# Patient Record
Sex: Female | Born: 1990 | Race: Black or African American | Hispanic: No | Marital: Single | Smoking: Current every day smoker
Health system: Southern US, Community
[De-identification: ages and names within clinical notes are randomized; demographics above are authoritative.]

## PROBLEM LIST (undated history)

## (undated) DIAGNOSIS — J45909 Unspecified asthma, uncomplicated: Secondary | ICD-10-CM

## (undated) DIAGNOSIS — O26899 Other specified pregnancy related conditions, unspecified trimester: Secondary | ICD-10-CM

## (undated) DIAGNOSIS — R197 Diarrhea, unspecified: Secondary | ICD-10-CM

## (undated) DIAGNOSIS — J019 Acute sinusitis, unspecified: Secondary | ICD-10-CM

## (undated) DIAGNOSIS — R112 Nausea with vomiting, unspecified: Principal | ICD-10-CM

## (undated) MED ORDER — IBUPROFEN 600 MG TAB
600 mg | ORAL_TABLET | Freq: Four times a day (QID) | ORAL | Status: DC | PRN
Start: ? — End: 2014-02-21

## (undated) MED ORDER — DIPHENHYDRAMINE 25 MG CAP
25 mg | ORAL_CAPSULE | Freq: Four times a day (QID) | ORAL | Status: AC | PRN
Start: ? — End: 2011-10-31

## (undated) MED ORDER — TRIMETHOPRIM-SULFAMETHOXAZOLE 160 MG-800 MG TAB
160-800 mg | ORAL_TABLET | Freq: Two times a day (BID) | ORAL | Status: AC
Start: ? — End: 2011-12-03

## (undated) MED ORDER — BENZONATATE 200 MG CAP
200 mg | ORAL_CAPSULE | Freq: Three times a day (TID) | ORAL | Status: AC | PRN
Start: ? — End: 2011-12-03

## (undated) MED ORDER — METHOCARBAMOL 500 MG TAB
500 mg | ORAL_TABLET | Freq: Every evening | ORAL | Status: DC
Start: ? — End: 2014-02-21

## (undated) MED ORDER — TRIMETHOPRIM-SULFAMETHOXAZOLE 160 MG-800 MG TAB
160-800 mg | ORAL_TABLET | Freq: Two times a day (BID) | ORAL | Status: AC
Start: ? — End: 2007-06-05

## (undated) MED ORDER — KETOROLAC TROMETHAMINE 10 MG TAB
10 mg | ORAL_TABLET | Freq: Four times a day (QID) | ORAL | Status: DC | PRN
Start: ? — End: 2014-02-21

## (undated) MED ORDER — AZITHROMYCIN 250 MG TAB
250 mg | PACK | ORAL | Status: AC
Start: ? — End: 2007-06-15

---
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
## 2009-01-18 NOTE — ED Notes (Signed)
Pt c/o nausea and cough x 2-3 days.

## 2009-01-18 NOTE — ED Notes (Signed)
E-sig not working. Paper copy signed

## 2009-01-18 NOTE — ED Provider Notes (Signed)
HPI Comments: The patient presents to ed with complaint of fever, cough, chills, and body aches. The patient state that it began two days ago. Patient refuses any treatment with needles.     Patient is a 19 y.o. female presenting with nausea. The history is provided by the patient. No language interpreter was used.   Nausea   This is a new problem. The current episode started 2 days ago. The problem occurs 2 to 4 times per day. The problem has not changed since onset. The emesis has an appearance of stomach contents. There has been a fever of 102 - 102.9 F. The fever has been present for 1 - 2 days. Associated symptoms include chills, a fever, cough and URI.        Past Medical History   Diagnosis Date   ??? Asthma           No past surgical history on file.      No family history on file.     History   Social History   ??? Marital Status: Single     Spouse Name: N/A     Number of Children: N/A   ??? Years of Education: N/A   Occupational History   ??? Not on file.   Social History Main Topics   ??? Smoking status: Current Everyday Smoker -- 0.5 packs/day   ??? Smokeless tobacco: Never Used   ??? Alcohol Use: Yes      Occasionally   ??? Drug Use: No   ??? Sexually Active: Yes     Birth Control/ Protection: None   Other Topics Concern   ??? Not on file   Social History Narrative   ??? No narrative on file           ALLERGIES: Review of patient's allergies indicates no known allergies.      Review of Systems   Constitutional: Positive for fever and chills.   Respiratory: Positive for cough.    Gastrointestinal: Positive for nausea.   All other systems reviewed and are negative.        Filed Vitals:    01/18/2009  7:03 PM 01/18/2009  8:22 PM 01/18/2009  8:31 PM   BP: 112/73 102/52    Pulse: 113 96    Temp: 102.6 ??F (39.2 ??C) 101.5 ??F (38.6 ??C) 101.1 ??F (38.4 ??C)   Resp: 18 20    Height: 5\' 8"  (1.727 m)     Weight: 180 lb (81.647 kg)     SpO2: 99% 99%               Physical Exam   Nursing note and vitals reviewed.   Constitutional: She is oriented to person, place, and time. She appears well-developed and well-nourished.   HENT:   Head: Normocephalic and atraumatic.   Right Ear: External ear normal.   Left Ear: External ear normal.   Nose: Nose normal.   Mouth/Throat: Oropharynx is clear and moist.   Eyes: Conjunctivae and extraocular motions are normal. Pupils are equal, round, and reactive to light.   Neck: Normal range of motion. Neck supple.   Cardiovascular: Normal rate, regular rhythm, normal heart sounds and intact distal pulses.    Pulmonary/Chest: Effort normal and breath sounds normal. No respiratory distress. She has no wheezes.   Abdominal: Soft. Bowel sounds are normal.   Musculoskeletal: Normal range of motion. She exhibits no edema and no tenderness.   Neurological: She is alert and oriented to person, place, and time.  No cranial nerve deficit. She exhibits normal muscle tone.   Skin: Skin is warm. She is diaphoretic.   Psychiatric: She has a normal mood and affect. Her behavior is normal. Judgment and thought content normal.        Coding    Procedures

## 2009-01-18 NOTE — ED Notes (Signed)
I have reviewed discharge instructions with the patient.  The patient verbalized understanding.

## 2009-01-19 LAB — INFLUENZA A & B AG (RAPID TEST)
Influenza A Ag: NEGATIVE
Influenza B Ag: NEGATIVE

## 2009-01-19 LAB — URINE MICROSCOPIC
Casts: 0 /LPF
Crystals, urine: 0 /LPF
Mucus: 0 /LPF
RBC: 0 /HPF

## 2009-01-19 LAB — HCG URINE, QL. - POC: Pregnancy test,urine (POC): NEGATIVE

## 2009-01-19 MED ORDER — NAPROXEN 500 MG TAB
500 mg | ORAL_TABLET | Freq: Two times a day (BID) | ORAL | Status: AC
Start: 2009-01-19 — End: 2009-01-28

## 2009-01-19 MED ORDER — METHYLPREDNISOLONE 4 MG TABS IN A DOSE PACK
4 mg | PACK | ORAL | Status: AC
Start: 2009-01-19 — End: 2009-01-24

## 2009-01-19 MED ADMIN — acetaminophen (TYLENOL) tablet 1,000 mg: ORAL | @ 01:00:00 | NDC 00182845389

## 2009-01-19 MED FILL — ACETAMINOPHEN 500 MG TAB: 500 mg | ORAL | Qty: 2

## 2009-09-30 NOTE — ED Provider Notes (Signed)
HPI Comments: Michelle Shaw is a 19 y.o. African American female who c/o stomach ache x 1 week. Around her belly button and on both sides. Been constant for a week. Nothing makes better or worse. No n/v/d. No fever. LMP August 10th putting her at 6 weeks by dates. No vaginal bleeding. No doctor of any kind. Denies all urinary symptoms. No trouble with stools.          Patient is a 19 y.o. female presenting with abdominal pain and Pregnant Now. The history is provided by the patient.   Abdominal Pain     Pregnancy Problem  Associated symptoms include abdominal pain.        Past Medical History   Diagnosis Date   ??? Asthma           No past surgical history on file.      No family history on file.     History   Social History   ??? Marital Status: Single     Spouse Name: N/A     Number of Children: N/A   ??? Years of Education: N/A   Occupational History   ??? Not on file.   Social History Main Topics   ??? Smoking status: Former Smoker -- 0.5 packs/day     Quit date: 08/30/2009   ??? Smokeless tobacco: Never Used   ??? Alcohol Use: Yes      Occasionally   ??? Drug Use: No   ??? Sexually Active: Yes     Birth Control/ Protection: None   Other Topics Concern   ??? Not on file   Social History Narrative   ??? No narrative on file                    ALLERGIES: Review of patient's allergies indicates no known allergies.      Review of Systems   Gastrointestinal: Positive for abdominal pain.   All other systems reviewed and are negative.        Filed Vitals:    09/30/09 2058   BP: 130/65   Pulse: 76   Temp: 98.2 ??F (36.8 ??C)   Resp: 18   Height: 5\' 7"  (1.702 m)   Weight: 215 lb (97.523 kg)   SpO2: 100%              Physical Exam   Nursing note and vitals reviewed.  Constitutional: She is oriented to person, place, and time. She appears well-developed and well-nourished. No distress.        smiling   HENT:   Head: Normocephalic and atraumatic.   Right Ear: External ear normal.   Left Ear: External ear normal.    Eyes: Conjunctivae and EOM are normal. No scleral icterus.   Neck: Neck supple.   Cardiovascular: Normal rate.    Pulmonary/Chest: Effort normal.   Abdominal: Soft. Bowel sounds are normal. She exhibits no distension. No tenderness.   Musculoskeletal: Normal range of motion.   Neurological: She is alert and oriented to person, place, and time.   Skin: Skin is warm and dry. She is not diaphoretic.   Psychiatric: She has a normal mood and affect. Her behavior is normal.        MDM    Procedures

## 2009-09-30 NOTE — ED Notes (Cosign Needed)
Urine dip neg  ucg pos

## 2009-09-30 NOTE — ED Notes (Signed)
Pt states intermittent nausea and stomach pains. Pt denies urinary discomfort, denies vaginal discharge. Pt in NAD at this time, VS as noted.

## 2009-10-01 LAB — METABOLIC PANEL, BASIC
Anion gap: 7 mmol/L (ref 7–16)
BUN: 10 MG/DL (ref 6–23)
CO2: 26 MMOL/L (ref 23–32)
Calcium: 8.7 MG/DL (ref 8.3–10.4)
Chloride: 103 MMOL/L (ref 98–107)
Creatinine: 0.9 MG/DL (ref 0.6–1.5)
GFR est AA: >60 mL/min/{1.73_m2} (ref >60)
GFR est non-AA: >60 mL/min/{1.73_m2} (ref >60)
Glucose: 72 MG/DL (ref 65–100)
Potassium: 3.6 MMOL/L (ref 3.5–5.1)
Sodium: 136 MMOL/L (ref 136–145)

## 2009-10-01 LAB — HCG URINE, QL. - POC: Pregnancy test,urine (POC): POSITIVE — AB

## 2009-10-01 LAB — BETA HCG, QT
Beta HCG, QT: 17372 m[IU]/mL — ABNORMAL HIGH (ref 0.0–6.0)
hCG Quant: 17372 m[IU]/mL — ABNORMAL HIGH (ref 0.0–6.0)

## 2009-10-01 LAB — CBC WITH AUTOMATED DIFF
ABS. BASOPHILS: 0 10*3/uL (ref 0.0–0.2)
ABS. EOSINOPHILS: 0.2 10*3/uL (ref 0.0–0.8)
ABS. IMM. GRANS.: 0 10*3/uL (ref 0.0–2.0)
ABS. LYMPHOCYTES: 1.7 10*3/uL (ref 0.5–4.6)
ABS. MONOCYTES: 0.4 10*3/uL (ref 0.1–1.3)
ABS. NEUTROPHILS: 2.7 10*3/uL (ref 1.7–8.2)
BASOPHILS: 0 % (ref 0.0–2.0)
EOSINOPHILS: 4 % (ref 0.5–7.8)
HCT: 36.3 % (ref 35.8–46.3)
HGB: 12 g/dL (ref 11.7–15.4)
IMMATURE GRANULOCYTES: 0.2 % (ref 0.0–2.0)
LYMPHOCYTES: 34 % (ref 13–44)
MCH: 27.9 PG (ref 26.1–32.9)
MCHC: 33.1 g/dL (ref 31.4–35.0)
MCV: 84.4 FL (ref 79.6–97.8)
MONOCYTES: 8 % (ref 4.0–12.0)
MPV: 10.6 FL — ABNORMAL LOW (ref 10.8–14.1)
NEUTROPHILS: 54 % (ref 43–78)
PLATELET: 196 10*3/uL (ref 150–450)
RBC: 4.3 M/uL (ref 4.05–5.25)
RDW: 12.3 % (ref 11.9–14.6)
WBC: 5 10*3/uL (ref 4.5–13.5)

## 2009-10-01 LAB — URINE MICROSCOPIC
Casts: 0 /LPF
Crystals, urine: 0 /LPF
Mucus: 0 /LPF
RBC: 0 /HPF

## 2009-10-01 MED ORDER — ACETAMINOPHEN 500 MG TAB
500 mg | ORAL | Status: AC
Start: 2009-10-01 — End: 2009-09-30
  Administered 2009-10-01: 03:00:00 via ORAL

## 2009-10-01 NOTE — ED Notes (Signed)
IUP, yolk sac, gestational sac, but no embryo. Ovaries visualized well per Jesusita Oka.

## 2009-10-01 NOTE — ED Notes (Signed)
Discharge instructions discussed with patient, patient verbalized understanding and denies questions. Patient ambulatory for discharge home with mother to drive patient home.

## 2009-12-02 MED ORDER — HYDROCODONE-ACETAMINOPHEN 5 MG-500 MG TAB
5-500 mg | ORAL_TABLET | ORAL | Status: AC | PRN
Start: 2009-12-02 — End: 2009-12-09

## 2009-12-02 NOTE — ED Notes (Signed)
Patient not in room x2
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
## 2009-12-02 NOTE — ED Notes (Signed)
Left prior to receiving discharge instructions and prescription

## 2009-12-02 NOTE — ED Notes (Signed)
Patient not in room

## 2009-12-02 NOTE — ED Provider Notes (Signed)
Patient is a 19 y.o. female presenting with hip pain and abdominal pain. The history is provided by the patient.   Hip Pain   This is a new problem. The current episode started more than 1 week ago. The problem occurs constantly. The problem has not changed since onset. The pain is present in the right hip (superpubic). The pain is at a severity of 8/10. Pertinent negatives include no numbness and full range of motion. She has tried nothing for the symptoms.   Abdominal Pain          Past Medical History   Diagnosis Date   ??? Asthma           No past surgical history on file.      No family history on file.     History   Social History   ??? Marital Status: Single     Spouse Name: N/A     Number of Children: N/A   ??? Years of Education: N/A   Occupational History   ??? Not on file.   Social History Main Topics   ??? Smoking status: Former Smoker -- 0.5 packs/day     Quit date: 08/30/2009   ??? Smokeless tobacco: Never Used   ??? Alcohol Use: No   ??? Drug Use: No   ??? Sexually Active: Yes     Birth Control/ Protection: None   Other Topics Concern   ??? Not on file   Social History Narrative   ??? No narrative on file                    ALLERGIES: Review of patient's allergies indicates no known allergies.      Review of Systems   Constitutional: Negative.  Negative for activity change.   HENT: Negative.    Eyes: Negative.    Respiratory: Negative.    Cardiovascular: Negative.    Gastrointestinal: Positive for abdominal pain.   Genitourinary: Negative.    Musculoskeletal: Negative.    Skin: Negative.    Neurological: Negative.  Negative for numbness.   Hematological: Negative.    Psychiatric/Behavioral: Negative.    All other systems reviewed and are negative.        Filed Vitals:    12/02/09 1240   BP: 101/65   Pulse: 97   Temp: 98.2 ??F (36.8 ??C)   Resp: 18   Height: 5\' 7"  (1.702 m)   Weight: 195 lb (88.451 kg)   SpO2: 98%              Physical Exam   Nursing note and vitals reviewed.   Constitutional: She is oriented to person, place, and time. She appears well-developed and well-nourished.   HENT:   Head: Normocephalic and atraumatic.   Right Ear: External ear normal.   Left Ear: External ear normal.   Eyes: Conjunctivae and EOM are normal. Pupils are equal, round, and reactive to light.   Neck: Normal range of motion. Neck supple.   Cardiovascular: Normal rate, regular rhythm and intact distal pulses.    Pulmonary/Chest: Effort normal and breath sounds normal.   Abdominal: Soft. Bowel sounds are normal.   Musculoskeletal: Normal range of motion.   Neurological: She is alert and oriented to person, place, and time. No cranial nerve deficit.   Skin: Skin is warm and dry.   Psychiatric: She has a normal mood and affect.        MDM     Amount and/or Complexity of Data  Reviewed:   Clinical lab tests:  Ordered and reviewed  Risk of Significant Complications, Morbidity, and/or Mortality:   Presenting problems:  Moderate  Diagnostic procedures:  Low  Management options:  Low  Progress:   Patient progress:  Stable      Procedures

## 2010-01-11 LAB — URINE MICROSCOPIC
Casts: 0 /LPF
Crystals, urine: 0 /LPF
Mucus: 0 /LPF

## 2010-01-11 LAB — WET PREP: Wet prep: 5

## 2010-01-11 MED ORDER — METRONIDAZOLE 0.75 % TOPICAL CREAM
0.75 % | Freq: Two times a day (BID) | CUTANEOUS | Status: DC
Start: 2010-01-11 — End: 2010-05-11

## 2010-01-11 MED ORDER — CEPHALEXIN 250 MG CAP
250 mg | ORAL_CAPSULE | Freq: Three times a day (TID) | ORAL | Status: AC
Start: 2010-01-11 — End: 2010-01-18

## 2010-01-11 NOTE — ED Provider Notes (Signed)
HPI Comments: Patient is a 19 yo female who c/o vaginal discharge and pain x 3 days. Patient denies F/N/V/D. Admits to a new sexual partner and is concerned of having an STD. Patient denies dysuria, frequency, urgency, flank pain or vaginal bleeding. Some LLQ pain on palpation. Thin, white discharge and erythema present on pelvic exam. Treated pt for UTI and BV. Instructed her to follow up with her OB.      Pmhx: asthma, previous STDs  Shx: former smoker    Patient is a 19 y.o. female presenting with vaginal discharge. The history is provided by the patient.   Vaginal Discharge   This is a new problem. The current episode started 2 days ago. The problem occurs constantly. The problem has been gradually worsening. The discharge occurs spontaneously. The discharge was white, malodorous and thin. She is pregnant. Associated symptoms include abdominal pain (occasionally), genital burning and genital itching. Pertinent negatives include no anorexia, no diaphoresis, no fever, no abdominal swelling, no constipation, no diarrhea, no nausea, no vomiting, no dysuria and no frequency. Risk factors include history of STDs. She has tried nothing for the symptoms. Her past medical history is significant for STD.        Past Medical History   Diagnosis Date   ??? Asthma           No past surgical history on file.      No family history on file.     History   Social History   ??? Marital Status: Single     Spouse Name: N/A     Number of Children: N/A   ??? Years of Education: N/A   Occupational History   ??? Not on file.   Social History Main Topics   ??? Smoking status: Former Smoker     Quit date: 08/30/2009   ??? Smokeless tobacco: Never Used   ??? Alcohol Use: No   ??? Drug Use: No   ??? Sexually Active: Yes     Birth Control/ Protection: None   Other Topics Concern   ??? Not on file   Social History Narrative   ??? No narrative on file                    ALLERGIES: Review of patient's allergies indicates no known allergies.      Review of Systems    Constitutional: Negative for fever, diaphoresis and fatigue.   Cardiovascular: Negative for chest pain and palpitations.   Gastrointestinal: Positive for abdominal pain (occasionally). Negative for nausea, vomiting, diarrhea, constipation and anorexia.   Genitourinary: Positive for vaginal discharge. Negative for dysuria, urgency, frequency, hematuria, flank pain, vaginal bleeding, difficulty urinating and pelvic pain.   Skin: Negative for color change and pallor.   Psychiatric/Behavioral: Negative for behavioral problems, confusion and agitation.       Filed Vitals:    01/11/10 1128   BP: 108/55   Pulse: 77   Temp: 98.4 ??F (36.9 ??C)   Resp: 18   Height: 5\' 7"  (1.702 m)   SpO2: 100%              Physical Exam   Constitutional: She is oriented to person, place, and time. Vital signs are normal. She appears well-developed and well-nourished.  Non-toxic appearance. She does not have a sickly appearance. She does not appear ill. No distress.   HENT:   Head: Normocephalic and atraumatic.   Cardiovascular: Normal rate, regular rhythm, S1 normal, S2 normal and normal heart  sounds.  Exam reveals no gallop.    No murmur heard.  Pulmonary/Chest: Effort normal and breath sounds normal. She has no decreased breath sounds. She has no wheezes.   Abdominal: Soft. Normal appearance. She exhibits no distension. Tenderness is present in the left lower quadrant. She has no rebound, no guarding and no CVA tenderness.   Genitourinary: No labial fusion. There is tenderness on the right labia. There is no rash or lesion on the right labia. There is tenderness on the left labia. There is no rash or lesion on the left labia. There is erythema around the vagina. No bleeding around the vagina. No foreign body around the vagina. Discharge (Thin, white discharge with mild erythema of the vagina and cervix) found.   Neurological: She is alert and oriented to person, place, and time.   Skin: Skin is warm and dry. She is not diaphoretic.    Psychiatric: She has a normal mood and affect. Her behavior is normal. Judgment and thought content normal.        MDM     Amount and/or Complexity of Data Reviewed:   Clinical lab tests:  Reviewed and ordered  Risk of Significant Complications, Morbidity, and/or Mortality:   Presenting problems:  Low  Diagnostic procedures:  Low  Management options:  Low      Procedures    Labs Reviewed   URINE MICROSCOPIC - Abnormal; Notable for the following:    ??? Bacteria 1+ (*)     All other components within normal limits   POC URINE DIPSTICK   CHLAMYDIA/GC DNA PROBE   WET PREP         I have discussed the results of labs, procedures, radiographs, treatments as well as any previous results found within the [facility] [employer] with the patient and available family.?? A treatment plan was developed in conjunction with the patient and was agreed upon. The patient is ready for discharge at this time.?? All voiced understanding of the discharge plan and medication instructions or changes as appropriate.?? Questions about treatment in the ED were answered.?? The patient was encouraged to return should symptoms worsen or new problems develop. A follow up physician was provided to the patient on the discharge papers.

## 2010-01-13 LAB — CHLAMYDIA/GC DNA PROBE
Chlamydia: NEGATIVE
N. gonorrhoeae: NEGATIVE

## 2010-05-11 MED ORDER — AMOXICILLIN 500 MG TABLET
500 mg | ORAL_TABLET | Freq: Three times a day (TID) | ORAL | Status: DC
Start: 2010-05-11 — End: 2011-04-15

## 2010-05-11 NOTE — ED Notes (Signed)
.  I have reviewed discharge instructions with the patient.  The patient verbalized understanding. Pt/family advised is s/s are not improved in 12-24 hours, OR if at any time they feel their condition is worsening, they should seek re-evaluation by their PMD or ER.  Script for amoxil rev'd and sent. Ambulatory. Unaccompnaied.

## 2010-05-11 NOTE — ED Provider Notes (Signed)
Patient is a 20 y.o. female presenting with sore throat and nasal congestion. The history is provided by the patient.   Sore Throat   This is a new problem. The current episode started 2 days ago. The problem has not changed since onset.Patient reports a subjective fever - was not measured.Associated symptoms include diarrhea, congestion and cough. Pertinent negatives include no vomiting, no drooling, no ear discharge, no ear pain, no headaches, no plugged ear sensation, no shortness of breath, no stridor, no swollen glands and no trouble swallowing. She has had no exposure to strep or mono. She has tried nothing for the symptoms.   Nasal Congestion  Pertinent negatives include no abdominal pain, no headaches and no shortness of breath.        Past Medical History   Diagnosis Date   ??? Asthma         No past surgical history on file.      No family history on file.     History     Social History   ??? Marital Status: Single     Spouse Name: N/A     Number of Children: N/A   ??? Years of Education: N/A     Occupational History   ??? Not on file.     Social History Main Topics   ??? Smoking status: Former Smoker     Quit date: 08/30/2009   ??? Smokeless tobacco: Never Used   ??? Alcohol Use: No   ??? Drug Use: No   ??? Sexually Active: Yes     Birth Control/ Protection: None     Other Topics Concern   ??? Not on file     Social History Narrative   ??? No narrative on file                  ALLERGIES: Review of patient's allergies indicates no known allergies.      Review of Systems   Constitutional: Negative.    HENT: Positive for congestion and sore throat. Negative for ear pain, drooling, trouble swallowing and ear discharge.    Eyes: Negative.    Respiratory: Positive for cough. Negative for shortness of breath and stridor.    Cardiovascular: Negative.    Gastrointestinal: Positive for diarrhea. Negative for nausea, vomiting, abdominal pain, constipation, blood in stool, abdominal distention, anal bleeding and rectal pain.    Genitourinary: Negative.    Musculoskeletal: Negative.    Skin: Negative.    Neurological: Negative.  Negative for headaches.   Psychiatric/Behavioral: Negative.    [all other systems reviewed and are negative        Filed Vitals:    05/11/10 0824   BP: 123/72   Pulse: 114   Temp: 99.5 ??F (37.5 ??C)   Resp: 20   Height: 5\' 7"  (1.702 m)   Weight: 227 lb (102.967 kg)   SpO2: 99%            Physical Exam   [nursing notereviewed.  Constitutional: She is oriented to person, place, and time. She appears well-developed and well-nourished. No distress.   HENT:   Head: Normocephalic and atraumatic.   Nose: Nose normal.   Mouth/Throat: Oropharynx is clear and moist.   Eyes: EOM are normal. Pupils are equal, round, and reactive to light.   Neck: Normal range of motion. Neck supple. No thyromegaly present.   Cardiovascular: Normal rate, regular rhythm and normal heart sounds.  Exam reveals no gallop and no friction rub.  No murmur heard.  Pulmonary/Chest: Effort normal and breath sounds normal. No respiratory distress.   Abdominal: Soft. Bowel sounds are normal. There is no tenderness. There is no rebound.   Musculoskeletal: Normal range of motion.   Neurological: She is alert and oriented to person, place, and time. No cranial nerve deficit.   Skin: Skin is warm and dry.   Psychiatric: She has a normal mood and affect. Her behavior is normal.        MDM    Procedures

## 2011-04-15 NOTE — ED Notes (Signed)
I have reviewed discharge instructions with the patient.  The patient verbalized understanding.  Patient ambulated out with no acute distress noted.

## 2011-04-15 NOTE — ED Notes (Signed)
C/o generalized body aches, head and neck pain, fever(has not checked) onset this am. Denies attempting otc meds.

## 2011-04-16 LAB — INFLUENZA A & B AG (RAPID TEST)
Influenza A Ag: NEGATIVE
Influenza B Ag: NEGATIVE

## 2011-04-16 MED ORDER — IBUPROFEN 800 MG TAB
800 mg | ORAL | Status: AC
Start: 2011-04-16 — End: 2011-04-15
  Administered 2011-04-16: 04:00:00 via ORAL

## 2011-04-16 MED FILL — IBUPROFEN 800 MG TAB: 800 mg | ORAL | Qty: 1

## 2011-04-16 NOTE — ED Provider Notes (Signed)
HPI Comments: Here with SO who c/o same.  His started yesterday.  Rhinorrhea, body aches, HA, diarrhea, cough.      Patient is a 21 y.o. female presenting with general illness. The history is provided by the patient.   Generalized Body Aches  This is a new problem. The current episode started 6 to 12 hours ago. The problem occurs constantly. The problem has not changed since onset.Associated symptoms include headaches. Pertinent negatives include no chest pain, no abdominal pain and no shortness of breath. Nothing relieves the symptoms. She has tried nothing for the symptoms.        Past Medical History   Diagnosis Date   ??? Asthma         Past Surgical History   Procedure Date   ??? Hx gyn      c/s         No family history on file.     History     Social History   ??? Marital Status: SINGLE     Spouse Name: N/A     Number of Children: N/A   ??? Years of Education: N/A     Occupational History   ??? Not on file.     Social History Main Topics   ??? Smoking status: Current Everyday Smoker     Last Attempt to Quit: 08/30/2009   ??? Smokeless tobacco: Never Used   ??? Alcohol Use: No   ??? Drug Use: No   ??? Sexually Active: Yes     Birth Control/ Protection: None     Other Topics Concern   ??? Not on file     Social History Narrative   ??? No narrative on file                  ALLERGIES: Review of patient's allergies indicates no known allergies.      Review of Systems   Respiratory: Negative for shortness of breath.    Cardiovascular: Negative for chest pain.   Gastrointestinal: Negative for abdominal pain.   Neurological: Positive for headaches.   All other systems reviewed and are negative.        Filed Vitals:    04/15/11 2200 04/15/11 2341   BP: 122/69 104/57   Pulse: 98 94   Temp: 99.4 ??F (37.4 ??C)    Resp: 18 18   Height: 5\' 7"  (1.702 m)    Weight: 101.606 kg (224 lb)    SpO2: 98%             Physical Exam   Nursing note and vitals reviewed.  Constitutional: She is oriented to person, place, and time. She appears well-developed and  well-nourished. No distress.   HENT:   Head: Normocephalic and atraumatic.   Mouth/Throat: Oropharyngeal exudate present.   Cardiovascular: Normal rate and regular rhythm.    Pulmonary/Chest: Effort normal and breath sounds normal.   Abdominal: Soft.   Musculoskeletal: Normal range of motion.   Neurological: She is alert and oriented to person, place, and time.   Skin: Skin is warm and dry. She is not diaphoretic.        MDM     Differential Diagnosis; Clinical Impression; Plan:     Pt looks very well, likely viral since SO with same  Amount and/or Complexity of Data Reviewed:   Clinical lab tests:  Ordered and reviewed  Risk of Significant Complications, Morbidity, and/or Mortality:   Presenting problems:  Minimal  Diagnostic procedures:  Minimal  Management  options:  Minimal  Progress:   Patient progress:  Stable      Procedures

## 2011-10-21 NOTE — ED Notes (Signed)
I have reviewed discharge instructions with the patient.  The patient verbalized understanding.

## 2011-10-21 NOTE — ED Provider Notes (Signed)
HPI Comments: Presents with 43 month old son, both stayed at friends with exposure to bed bugs and now with multiple pruritic bug bites noted.      Patient is a 21 y.o. female presenting with Insect Bite. The history is provided by the patient.   Insect Bite  This is a new problem. The current episode started yesterday. The problem occurs constantly. The problem has not changed since onset.Pertinent negatives include no chest pain, no abdominal pain, no headaches and no shortness of breath. Nothing aggravates the symptoms. Nothing relieves the symptoms. She has tried nothing for the symptoms. The treatment provided no relief.        Past Medical History   Diagnosis Date   ??? Asthma         Past Surgical History   Procedure Date   ??? Hx gyn      c/s         History reviewed. No pertinent family history.     History     Social History   ??? Marital Status: SINGLE     Spouse Name: N/A     Number of Children: N/A   ??? Years of Education: N/A     Occupational History   ??? Not on file.     Social History Main Topics   ??? Smoking status: Current Everyday Smoker -- 0.5 packs/day     Last Attempt to Quit: 08/30/2009   ??? Smokeless tobacco: Never Used   ??? Alcohol Use: No   ??? Drug Use: No   ??? Sexually Active: Yes     Birth Control/ Protection: None     Other Topics Concern   ??? Not on file     Social History Narrative   ??? No narrative on file                  ALLERGIES: Review of patient's allergies indicates no known allergies.      Review of Systems   Respiratory: Negative for shortness of breath.    Cardiovascular: Negative for chest pain.   Gastrointestinal: Negative for abdominal pain.   Neurological: Negative for headaches.   All other systems reviewed and are negative.        Filed Vitals:    10/21/11 0447   BP: 110/65   Pulse: 89   Temp: 97.4 ??F (36.3 ??C)   Resp: 19   Height: 5\' 7"  (1.702 m)   Weight: 108.863 kg (240 lb)   SpO2: 99%            Physical Exam   Nursing note and vitals reviewed.  Constitutional: She is oriented to  person, place, and time. She appears well-developed and well-nourished. No distress.   HENT:   Head: Normocephalic and atraumatic.   Right Ear: Tympanic membrane and external ear normal.   Left Ear: Tympanic membrane and external ear normal.   Mouth/Throat: Oropharynx is clear and moist.   Eyes: Conjunctivae and EOM are normal. Pupils are equal, round, and reactive to light.   Neck: Normal range of motion. Neck supple. No tracheal deviation present.   Cardiovascular: Normal rate, regular rhythm, normal heart sounds and intact distal pulses.  Exam reveals no gallop and no friction rub.    No murmur heard.  Pulmonary/Chest: Effort normal and breath sounds normal. No respiratory distress. She has no wheezes.   Abdominal: Soft. Bowel sounds are normal. She exhibits no distension and no mass. There is no tenderness. There is no rebound and  no guarding.   Musculoskeletal: Normal range of motion. She exhibits no edema.   Lymphadenopathy:     She has no cervical adenopathy.   Neurological: She is alert and oriented to person, place, and time. She displays normal reflexes. No cranial nerve deficit.   Skin: Skin is warm and dry. Rash (scattered small urticarial lesions c/w insect bites, most likely bed bug bites.) noted. She is not diaphoretic. No erythema.   Psychiatric: She has a normal mood and affect.        MDM    Procedures

## 2011-10-21 NOTE — ED Notes (Signed)
Pt presents to ED with a CC of bug bites all over body. Pt reports she was at significant others house yesterday when she noticed the bites.  Pt has attempted no treatment PTA.

## 2011-11-26 NOTE — ED Notes (Signed)
I have reviewed discharge instructions with the patient.  The patient verbalized understanding. Prescriptions given and instructions covered for medications. E-sign not working, patient signed hard copy for records.

## 2011-11-26 NOTE — ED Provider Notes (Addendum)
HPI Comments: Pt presents to the ER with a 3 day history of cough and congestion and sinus pressure.  She denies any fever or chills at this time.  She is here with her son who is also presenting with the same symptoms.       Patient is a 21 y.o. female presenting with cough and chills. The history is provided by the patient. No language interpreter was used.   Cough  This is a new problem. The current episode started 2 days ago. The problem has been gradually worsening. The cough is non-productive. There has been no fever. Associated symptoms include chills and rhinorrhea. Pertinent negatives include no chest pain, no sweats, no weight loss, no eye redness, no ear congestion, no ear pain, no headaches, no sore throat, no myalgias, no shortness of breath, no wheezing, no nausea, no vomiting and no confusion. She has tried nothing for the symptoms. The treatment provided no relief. She is not a smoker. Her past medical history does not include bronchitis, pneumonia, bronchiectasis, COPD, emphysema, asthma, cancer, heart failure or CHF.   Chills   This is a new problem. The current episode started 2 days ago. Associated symptoms include congestion and cough. Pertinent negatives include no chest pain, no vomiting, no headaches, no sore throat, no shortness of breath and no neck pain. She has tried nothing for the symptoms.        Past Medical History   Diagnosis Date   ??? Asthma         Past Surgical History   Procedure Laterality Date   ??? Hx gyn       c/s         History reviewed. No pertinent family history.     History     Social History   ??? Marital Status: SINGLE     Spouse Name: N/A     Number of Children: N/A   ??? Years of Education: N/A     Occupational History   ??? Not on file.     Social History Main Topics   ??? Smoking status: Current Every Day Smoker -- 0.50 packs/day     Last Attempt to Quit: 08/30/2009   ??? Smokeless tobacco: Never Used   ??? Alcohol Use: No   ??? Drug Use: No   ??? Sexually Active: Yes     Birth  Control/ Protection: None     Other Topics Concern   ??? Not on file     Social History Narrative   ??? No narrative on file                  ALLERGIES: Review of patient's allergies indicates no known allergies.      Review of Systems   Constitutional: Positive for chills. Negative for fever, weight loss, diaphoresis, activity change, appetite change and unexpected weight change.   HENT: Positive for congestion, rhinorrhea, postnasal drip and sinus pressure. Negative for hearing loss, ear pain, nosebleeds, sore throat, facial swelling, mouth sores, neck pain, neck stiffness, dental problem, tinnitus and ear discharge.    Eyes: Negative.  Negative for redness.   Respiratory: Positive for cough. Negative for chest tightness, shortness of breath and wheezing.    Cardiovascular: Negative.  Negative for chest pain.   Gastrointestinal: Negative.  Negative for nausea, vomiting and abdominal pain.   Genitourinary: Negative.    Musculoskeletal: Negative.  Negative for myalgias and arthralgias.   Skin: Negative.    Allergic/Immunologic: Negative.    Neurological:  Negative.  Negative for headaches.   Hematological: Negative.    Psychiatric/Behavioral: Negative.  Negative for confusion.   All other systems reviewed and are negative.        Filed Vitals:    11/26/11 1430   BP: 117/84   Pulse: 95   Temp: 97.9 ??F (36.6 ??C)   Resp: 18   Height: 5\' 7"  (1.702 m)   Weight: 99.791 kg (220 lb)   SpO2: 96%            Physical Exam   Vitals reviewed.  Constitutional: She appears well-developed and well-nourished. No distress.   HENT:   Head: Normocephalic.   Right Ear: Tympanic membrane is erythematous and bulging.   Left Ear: Tympanic membrane is erythematous and bulging.   Nose: Mucosal edema and rhinorrhea present.   Mouth/Throat: Posterior oropharyngeal edema and posterior oropharyngeal erythema present. No oropharyngeal exudate or tonsillar abscesses.       Eyes: Pupils are equal, round, and reactive to light.   Neck: Normal range of  motion. No JVD present. No tracheal deviation present. No thyromegaly present.   Cardiovascular: Normal rate.    Pulmonary/Chest: Effort normal. No stridor.   Abdominal: Soft.   Genitourinary: Vagina normal.   Musculoskeletal: Normal range of motion.   Lymphadenopathy:     She has no cervical adenopathy.   Neurological: She is alert.   Skin: Skin is warm. She is not diaphoretic.        MDM     Differential Diagnosis; Clinical Impression; Plan:     Acute otitis media, acute sinusitis media, dc to home with bactrim and tesslon   Amount and/or Complexity of Data Reviewed:   Discussion of test results with the performing providers:  No   Decide to obtain previous medical records or to obtain history from someone other than the patient:  No   Obtain history from someone other than the patient:  No   Review and summarize past medical records:  No   Discuss the patient with another provider:  No   Independant visualization of image, tracing, or specimen:  No  Risk of Significant Complications, Morbidity, and/or Mortality:   Presenting problems:  Low  Diagnostic procedures:  Low  Management options:  Low  Progress:   Patient progress:  Improved and stable      Procedures

## 2012-08-10 NOTE — ED Notes (Signed)
Called 3 times no answer.
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
## 2013-01-26 NOTE — ED Notes (Signed)
Patient brought to ER room, pt awaiting provider.      Meta Kroenke M Vonte Rossin, RN

## 2013-01-26 NOTE — ED Notes (Signed)
I have reviewed discharge instructions with the patient.  The patient verbalized understanding. Patient ambulatory to lobby in no acute distress. 2 prescriptions provided.    Dannah Ryles M Argie Applegate, RN

## 2014-02-21 DIAGNOSIS — S39012A Strain of muscle, fascia and tendon of lower back, initial encounter: Secondary | ICD-10-CM

## 2014-02-21 NOTE — ED Notes (Signed)
While "moving stuff around my house i think i pulled a muscle in my back"  - complains of lower back pain

## 2014-02-22 ENCOUNTER — Inpatient Hospital Stay
Admit: 2014-02-22 | Discharge: 2014-02-22 | Disposition: A | Discharge status: Home / Self Care | Payer: MEDICAID | Attending: Emergency Medicine

## 2014-02-22 MED ORDER — CYCLOBENZAPRINE 10 MG TAB
10 mg | ORAL_TABLET | Freq: Three times a day (TID) | ORAL | Status: DC | PRN
Start: 2014-02-22 — End: 2014-05-12

## 2014-02-22 MED ORDER — NAPROXEN 500 MG TAB
500 mg | ORAL_TABLET | Freq: Two times a day (BID) | ORAL | Status: AC
Start: 2014-02-22 — End: 2014-03-04

## 2014-02-22 MED ORDER — HYDROCODONE-ACETAMINOPHEN 7.5 MG-325 MG TAB
ORAL_TABLET | Freq: Four times a day (QID) | ORAL | Status: DC | PRN
Start: 2014-02-22 — End: 2014-05-12

## 2014-02-22 NOTE — ED Provider Notes (Signed)
HPI Comments: Report 24-year-old female complaining of low back pain after moving boxes today.  States that she was moving heavy boxes and felt pain in her lower back.  Since then it hurts to move.  No neurological deficits paresthesias or bowel or bladder dysfunction.    Patient is a 24 y.o. female presenting with back pain. The history is provided by the patient.   Back Pain   This is a new problem. The current episode started 6 to 12 hours ago. The problem has not changed since onset.The problem occurs constantly. The pain is associated with lifting. The pain is present in the lumbar spine. The quality of the pain is described as stabbing. The pain does not radiate.        Past Medical History:   Diagnosis Date   ??? Asthma        Past Surgical History:   Procedure Laterality Date   ??? Hx gyn       c/s         History reviewed. No pertinent family history.    History     Social History   ??? Marital Status: SINGLE     Spouse Name: N/A     Number of Children: N/A   ??? Years of Education: N/A     Occupational History   ??? Not on file.     Social History Main Topics   ??? Smoking status: Current Every Day Smoker -- 0.50 packs/day     Last Attempt to Quit: 08/30/2009   ??? Smokeless tobacco: Never Used   ??? Alcohol Use: No   ??? Drug Use: No   ??? Sexual Activity: Yes     Birth Control/ Protection: None     Other Topics Concern   ??? Not on file     Social History Narrative           ALLERGIES: Review of patient's allergies indicates no known allergies.      Review of Systems   Constitutional: Negative.  Negative for activity change.   HENT: Negative.    Eyes: Negative.    Respiratory: Negative.    Cardiovascular: Negative.    Gastrointestinal: Negative.    Genitourinary: Negative.    Musculoskeletal: Positive for back pain.   Skin: Negative.    Neurological: Negative.    Psychiatric/Behavioral: Negative.    All other systems reviewed and are negative.      Filed Vitals:    02/21/14 2208   BP: 137/73   Pulse: 83    Temp: 98.9 ??F (37.2 ??C)   Resp: 16   Height: 5\' 8"  (1.727 m)   Weight: 112.038 kg (247 lb)   SpO2: 100%            Physical Exam   Constitutional: She is oriented to person, place, and time. She appears well-developed and well-nourished. No distress.   HENT:   Head: Normocephalic and atraumatic.   Right Ear: External ear normal.   Left Ear: External ear normal.   Nose: Nose normal.   Mouth/Throat: Oropharynx is clear and moist. No oropharyngeal exudate.   Eyes: Conjunctivae and EOM are normal. Pupils are equal, round, and reactive to light. Right eye exhibits no discharge. Left eye exhibits no discharge. No scleral icterus.   Neck: Normal range of motion. Neck supple. No JVD present. No tracheal deviation present.   Cardiovascular: Normal rate, regular rhythm and intact distal pulses.    Pulmonary/Chest: Effort normal and breath sounds normal. No stridor.  No respiratory distress. She has no wheezes. She exhibits no tenderness.   Abdominal: Soft. Bowel sounds are normal. She exhibits no distension and no mass. There is no tenderness.   Musculoskeletal: Normal range of motion. She exhibits no edema.        Lumbar back: She exhibits tenderness and pain.        Back:    Neurological: She is alert and oriented to person, place, and time. No cranial nerve deficit.   Skin: Skin is warm and dry. No rash noted. She is not diaphoretic. No erythema. No pallor.   Psychiatric: She has a normal mood and affect. Her behavior is normal. Thought content normal.   Nursing note and vitals reviewed.       MDM  Number of Diagnoses or Management Options  Lumbar strain, initial encounter: minor      Procedures

## 2014-02-22 NOTE — ED Notes (Signed)
I have reviewed discharge instructions with the patient.  The patient verbalized understanding.

## 2014-02-22 NOTE — ED Notes (Signed)
Pt was lifting a boxes out of a car. C/O low back pain with movement or bending  Since 5 30, has not taking any ,medication

## 2014-05-12 ENCOUNTER — Emergency Department: Admit: 2014-05-13 | Payer: MEDICAID

## 2014-05-12 DIAGNOSIS — R079 Chest pain, unspecified: Secondary | ICD-10-CM

## 2014-05-12 NOTE — ED Notes (Signed)
CBC negative troponin is negative.  Clinical exam swelling consistent with chest wall pain.

## 2014-05-12 NOTE — ED Notes (Signed)
The patient was given their discharge instructions and  was given prescriptions.   The  patient verbalized understanding and had no additional questions. The patient was alert and was discharged via Ambulatory, without additional complaints at time of discharge.  No apparent distress noted

## 2014-05-12 NOTE — ED Provider Notes (Signed)
HPI Comments: 24 year old female was performing some exercises as part of a job interview and orientation for [employer].  She started noticing onset of some sharp substernal left chest pains that radiate to the back this evening.  Hurts worse when she moves a lesser extent to when she breathes.  No rash no fever no vomiting no substernal component to the pain.  No pulmonary embolism risk factors    Patient is a 24 y.o. female presenting with chest pain. The history is provided by the patient.   Chest Pain   This is a new problem. The current episode started 3 to 5 hours ago. The problem has not changed since onset.The problem occurs constantly. The pain is associated with exertion. The pain is present in the left side. The pain is moderate. The quality of the pain is described as sharp and stabbing. The pain radiates to the mid back. Associated symptoms include back pain. Pertinent negatives include no abdominal pain, no cough, no diaphoresis, no fever, no headaches, no irregular heartbeat, no lower extremity edema, no nausea, no palpitations, no shortness of breath and no vomiting. Risk factors include no risk factors. Her past medical history does not include DVT or PE.        Past Medical History:   Diagnosis Date   ??? Asthma        Past Surgical History:   Procedure Laterality Date   ??? Hx gyn       c/s         History reviewed. No pertinent family history.    History     Social History   ??? Marital Status: SINGLE     Spouse Name: N/A   ??? Number of Children: N/A   ??? Years of Education: N/A     Occupational History   ??? Not on file.     Social History Main Topics   ??? Smoking status: Current Every Day Smoker -- 0.50 packs/day     Last Attempt to Quit: 08/30/2009   ??? Smokeless tobacco: Never Used   ??? Alcohol Use: No   ??? Drug Use: No   ??? Sexual Activity: Yes     Birth Control/ Protection: None     Other Topics Concern   ??? Not on file     Social History Narrative            ALLERGIES: Review of patient's allergies indicates no known allergies.      Review of Systems   Constitutional: Negative for fever, chills and diaphoresis.   HENT: Negative for ear pain.    Respiratory: Negative for cough and shortness of breath.    Cardiovascular: Positive for chest pain. Negative for palpitations.   Gastrointestinal: Negative for nausea, vomiting, abdominal pain and diarrhea.   Genitourinary: Negative for dysuria and flank pain.   Musculoskeletal: Positive for back pain. Negative for neck pain.   Skin: Negative for color change and rash.   Neurological: Negative for syncope and headaches.       Filed Vitals:    05/12/14 2120 05/12/14 2211   BP: 151/80    Pulse: 84    Temp: 98 ??F (36.7 ??C)    Resp: 20    Height: 5\' 7"  (1.702 m)    Weight: 107.956 kg (238 lb)    SpO2: 98% 100%            Physical Exam   Constitutional: She is oriented to person, place, and time. She appears well-developed and well-nourished.  No distress.   HENT:   Head: Normocephalic and atraumatic.   Mouth/Throat: Oropharynx is clear and moist. No oropharyngeal exudate.   Eyes: Conjunctivae and EOM are normal. Pupils are equal, round, and reactive to light.   Neck: Normal range of motion. Neck supple.   Cardiovascular: Normal rate, regular rhythm and intact distal pulses.    No murmur heard.  Pulmonary/Chest: Breath sounds normal. No respiratory distress.       Abdominal: Soft. Bowel sounds are normal. She exhibits no mass. There is no tenderness. There is no rebound and no guarding. No hernia.   Neurological: She is alert and oriented to person, place, and time. Gait normal.   Nl speech   Skin: Skin is warm and dry.   Psychiatric: She has a normal mood and affect. Her speech is normal.   Nursing note and vitals reviewed.       MDM  Number of Diagnoses or Management Options  Diagnosis management comments: EKG normal.  No evidence for pericarditis.  Chest x-ray negative for no signs of pneumothorax.  PERC   This pt's pretest probability is <15% & answers is no to all ?s  Rules out PE if all criteria are present and pre-test probability is ?15%.    Age > 50: no   HR ? 100: no   O2 Sat on Room Air < 95%: no   Prior History of DVT/PE: no   Recent Trauma or Surgery: no   Hemoptysis: no   Exogenous Estrogen: no   Unilateral Leg Swelling: no           Amount and/or Complexity of Data Reviewed  Clinical lab tests: reviewed  Tests in the radiology section of CPT??: reviewed  Tests in the medicine section of CPT??: reviewed  Independent visualization of images, tracings, or specimens: yes    Risk of Complications, Morbidity, and/or Mortality  Presenting problems: moderate  Diagnostic procedures: moderate  Management options: moderate    Patient Progress  Patient progress: stable      Procedures

## 2014-05-12 NOTE — ED Notes (Addendum)
Pt states she had physical testing yesterday for job [employer] and now c/o left side chest pain with arm movement and inspiration.

## 2014-05-13 ENCOUNTER — Inpatient Hospital Stay
Admit: 2014-05-13 | Discharge: 2014-05-13 | Disposition: A | Discharge status: Home / Self Care | Payer: MEDICAID | Attending: Emergency Medicine

## 2014-05-13 LAB — EKG, 12 LEAD, INITIAL
Atrial Rate: 75 {beats}/min
Calculated P Axis: 46 degrees
Calculated R Axis: 40 degrees
Calculated T Axis: 39 degrees
P-R Interval: 126 ms
Q-T Interval: 386 ms
QRS Duration: 72 ms
QTC Calculation (Bezet): 425 ms
Ventricular Rate: 73 {beats}/min

## 2014-05-13 LAB — CBC WITH AUTOMATED DIFF
ABS. BASOPHILS: 0 10*3/uL (ref 0.0–0.2)
ABS. EOSINOPHILS: 0.1 10*3/uL (ref 0.0–0.8)
ABS. IMM. GRANS.: 0 10*3/uL (ref 0.0–0.5)
ABS. LYMPHOCYTES: 2 10*3/uL (ref 0.5–4.6)
ABS. MONOCYTES: 0.3 10*3/uL (ref 0.1–1.3)
ABS. NEUTROPHILS: 2.9 10*3/uL (ref 1.7–8.2)
BASOPHILS: 0 % (ref 0.0–2.0)
EOSINOPHILS: 2 % (ref 0.5–7.8)
HCT: 39.8 % (ref 35.8–46.3)
HGB: 12.9 g/dL (ref 11.7–15.4)
IMMATURE GRANULOCYTES: 0.2 % (ref 0.0–5.0)
LYMPHOCYTES: 37 % (ref 13–44)
MCH: 27.6 PG (ref 26.1–32.9)
MCHC: 32.4 g/dL (ref 31.4–35.0)
MCV: 85 FL (ref 79.6–97.8)
MONOCYTES: 6 % (ref 4.0–12.0)
MPV: 10.3 FL — ABNORMAL LOW (ref 10.8–14.1)
NEUTROPHILS: 55 % (ref 43–78)
PLATELET: 212 10*3/uL (ref 150–450)
RBC: 4.68 M/uL (ref 4.05–5.25)
RDW: 12.8 % (ref 11.9–14.6)
WBC: 5.3 10*3/uL (ref 4.3–11.1)

## 2014-05-13 LAB — CK: CK: 298 U/L — ABNORMAL HIGH (ref 21–215)

## 2014-05-13 LAB — METABOLIC PANEL, COMPREHENSIVE
A-G Ratio: 0.8 — ABNORMAL LOW (ref 1.2–3.5)
ALT (SGPT): 23 U/L (ref 12–65)
AST (SGOT): 16 U/L (ref 15–37)
Albumin: 3.6 g/dL (ref 3.5–5.0)
Alk. phosphatase: 84 U/L (ref 50–136)
Anion gap: 9 mmol/L (ref 7–16)
BUN: 10 MG/DL (ref 6–23)
Bilirubin, total: 0.3 MG/DL (ref 0.2–1.1)
CO2: 27 mmol/L (ref 21–32)
Calcium: 8.6 MG/DL (ref 8.3–10.4)
Chloride: 104 mmol/L (ref 98–107)
Creatinine: 1.13 MG/DL — ABNORMAL HIGH (ref 0.6–1.0)
GFR est AA: >60 mL/min/{1.73_m2} (ref >60)
GFR est non-AA: >60 mL/min/{1.73_m2} (ref >60)
Globulin: 4.4 g/dL — ABNORMAL HIGH (ref 2.3–3.5)
Glucose: 82 mg/dL (ref 65–100)
Potassium: 3.2 mmol/L — ABNORMAL LOW (ref 3.5–5.1)
Protein, total: 8 g/dL (ref 6.3–8.2)
Sodium: 140 mmol/L (ref 136–145)

## 2014-05-13 LAB — POC TROPONIN: Troponin-I (POC): 0 ng/ml (ref 0.0–0.08)

## 2014-05-13 LAB — LIPASE: Lipase: 86 U/L (ref 73–393)

## 2014-05-13 MED ORDER — HYDROCODONE-ACETAMINOPHEN 7.5 MG-325 MG TAB
ORAL_TABLET | Freq: Four times a day (QID) | ORAL | Status: DC | PRN
Start: 2014-05-13 — End: 2018-03-25

## 2014-05-13 MED ORDER — KETOROLAC TROMETHAMINE 30 MG/ML INJECTION
30 mg/mL (1 mL) | INTRAMUSCULAR | Status: AC
Start: 2014-05-13 — End: 2014-05-12
  Administered 2014-05-13: 03:00:00 via INTRAVENOUS

## 2014-05-13 MED ORDER — HYDROCODONE-ACETAMINOPHEN 10 MG-325 MG TAB
10-325 mg | ORAL | Status: AC
Start: 2014-05-13 — End: 2014-05-12
  Administered 2014-05-13: 04:00:00 via ORAL

## 2014-05-13 MED ORDER — NAPROXEN SODIUM 550 MG TAB
550 mg | ORAL_TABLET | Freq: Three times a day (TID) | ORAL | Status: DC
Start: 2014-05-13 — End: 2018-03-25

## 2014-05-13 MED FILL — HYDROCODONE-ACETAMINOPHEN 10 MG-325 MG TAB: 10-325 mg | ORAL | Qty: 1

## 2014-05-13 MED FILL — KETOROLAC TROMETHAMINE 30 MG/ML INJECTION: 30 mg/mL (1 mL) | INTRAMUSCULAR | Qty: 1

## 2014-11-16 ENCOUNTER — Inpatient Hospital Stay: Admit: 2014-11-16 | Discharge: 2014-11-16 | Payer: Self-pay | Attending: Emergency Medicine

## 2014-11-16 ENCOUNTER — Emergency Department: Payer: Self-pay

## 2014-11-16 DIAGNOSIS — R05 Cough: Secondary | ICD-10-CM

## 2014-11-16 MED ORDER — ALBUTEROL SULFATE 0.083 % (0.83 MG/ML) SOLN FOR INHALATION
2.5 mg /3 mL (0.083 %) | RESPIRATORY_TRACT | Status: AC
Start: 2014-11-16 — End: 2014-11-16
  Administered 2014-11-16: 07:00:00 via RESPIRATORY_TRACT

## 2014-11-16 MED FILL — ALBUTEROL SULFATE 0.083 % (0.83 MG/ML) SOLN FOR INHALATION: 2.5 mg /3 mL (0.083 %) | RESPIRATORY_TRACT | Qty: 1

## 2014-11-16 NOTE — ED Provider Notes (Signed)
HPI Comments: ppatient has a three-day history of cough and shortness of breath.  She has a history of asthma though she has not had a flareup in around 8 years.  She has not taken any medicine for her symptoms and she denies any aggravating or alleviating factors.  She states that her cough is sometimes productive but generally is dry. She is having some chest pain when she coughs as well.    Elements of this note were made using speech recognition software.  As such, errors of speech recognition may occur.    Patient is a 24 y.o. female presenting with cough. The history is provided by the patient.   Cough   Pertinent negatives include no chills, no nausea and no vomiting.        Past Medical History:   Diagnosis Date   ??? Asthma        Past Surgical History:   Procedure Laterality Date   ??? Hx gyn       c/s         History reviewed. No pertinent family history.    Social History     Social History   ??? Marital status: SINGLE     Spouse name: N/A   ??? Number of children: N/A   ??? Years of education: N/A     Occupational History   ??? Not on file.     Social History Main Topics   ??? Smoking status: Current Every Day Smoker     Packs/day: 0.50     Last attempt to quit: 08/30/2009   ??? Smokeless tobacco: Never Used   ??? Alcohol use No   ??? Drug use: No   ??? Sexual activity: Yes     Birth control/ protection: None     Other Topics Concern   ??? Not on file     Social History Narrative         ALLERGIES: Review of patient's allergies indicates no known allergies.    Review of Systems   Constitutional: Negative for chills and fever.   Respiratory: Positive for cough.    Gastrointestinal: Negative for nausea and vomiting.   All other systems reviewed and are negative.      Vitals:    11/16/14 0056   BP: 145/85   Pulse: 90   Resp: 20   Temp: 99 ??F (37.2 ??C)   SpO2: 96%   Weight: 113.4 kg (250 lb)   Height:  (1.702 m)            Physical Exam   Constitutional: She is oriented to person, place, and time. She appears  well-developed and well-nourished.   HENT:   Head: Normocephalic and atraumatic.   Eyes: Conjunctivae are normal. Pupils are equal, round, and reactive to light.   Neck: Normal range of motion. Neck supple.   Cardiovascular: Normal rate and regular rhythm.    Pulmonary/Chest: Effort normal and breath sounds normal. No respiratory distress. She has no wheezes.   Abdominal: Soft. Bowel sounds are normal.   Musculoskeletal: She exhibits no edema or tenderness.   Neurological: She is alert and oriented to person, place, and time.   Skin: Skin is warm and dry.   Psychiatric: She has a normal mood and affect. Her behavior is normal.   Nursing note and vitals reviewed.       MDM  Number of Diagnoses or Management Options  Diagnosis management comments: Differential diagnoses: Asthma exacerbation, pneumonia, bronchitis  Amount and/or Complexity of Data Reviewed  Tests in the radiology section of CPT??: ordered    Risk of Complications, Morbidity, and/or Mortality  Presenting problems: moderate  Diagnostic procedures: low  Management options: moderate      ED Course       Procedures

## 2014-11-16 NOTE — ED Notes (Signed)
Pt c/o cough for several days and having difficulty getting her breath

## 2014-11-16 NOTE — ED Notes (Signed)
Tolerated albuterol treatment without problems.. Refused  Chest xray. States that she doesn"t need it.  Proceeded to leave with further evaluation

## 2014-11-16 NOTE — ED Triage Notes (Signed)
Pt c/o productive painful cough for 3 days.

## 2014-11-16 NOTE — Progress Notes (Signed)
Pt not ready for X-ray at 3:10 AM

## 2015-06-14 ENCOUNTER — Inpatient Hospital Stay
Admit: 2015-06-14 | Discharge: 2015-06-15 | Disposition: A | Discharge status: Home / Self Care | Payer: Self-pay | Attending: Emergency Medicine

## 2015-06-14 ENCOUNTER — Emergency Department: Admit: 2015-06-14 | Payer: Self-pay

## 2015-06-14 DIAGNOSIS — M25472 Effusion, left ankle: Secondary | ICD-10-CM

## 2015-06-14 LAB — CBC WITH AUTOMATED DIFF
ABS. BASOPHILS: 0 10*3/uL (ref 0.0–0.2)
ABS. EOSINOPHILS: 0.2 10*3/uL (ref 0.0–0.8)
ABS. IMM. GRANS.: 0 10*3/uL (ref 0.0–0.5)
ABS. LYMPHOCYTES: 2.1 10*3/uL (ref 0.5–4.6)
ABS. MONOCYTES: 0.4 10*3/uL (ref 0.1–1.3)
ABS. NEUTROPHILS: 2.8 10*3/uL (ref 1.7–8.2)
BASOPHILS: 0 % (ref 0.0–2.0)
EOSINOPHILS: 3 % (ref 0.5–7.8)
HCT: 36.5 % (ref 35.8–46.3)
HGB: 12 g/dL (ref 11.7–15.4)
IMMATURE GRANULOCYTES: 0.4 % (ref 0.0–5.0)
LYMPHOCYTES: 38 % (ref 13–44)
MCH: 27.3 PG (ref 26.1–32.9)
MCHC: 32.9 g/dL (ref 31.4–35.0)
MCV: 83 FL (ref 79.6–97.8)
MONOCYTES: 7 % (ref 4.0–12.0)
MPV: 10.2 FL — ABNORMAL LOW (ref 10.8–14.1)
NEUTROPHILS: 52 % (ref 43–78)
PLATELET: 234 10*3/uL (ref 150–450)
RBC: 4.4 M/uL (ref 4.05–5.25)
RDW: 12.6 % (ref 11.9–14.6)
WBC: 5.4 10*3/uL (ref 4.3–11.1)

## 2015-06-14 LAB — HCG URINE, QL. - POC: Pregnancy test,urine (POC): NEGATIVE

## 2015-06-14 LAB — URINE MICROSCOPIC
Casts: 0 /lpf
Crystals, urine: 0 /LPF
Mucus: 0 /lpf

## 2015-06-14 NOTE — ED Notes (Signed)
Patient is no longer in bed. Both restrooms checked. IV access was removed prior to patient elopement.

## 2015-06-14 NOTE — ED Notes (Signed)
Patient states she needs to leave to pick her children up. MD notified.

## 2015-06-14 NOTE — ED Triage Notes (Addendum)
Bilateral ankle swelling, only HTN history is during pregnancy.

## 2015-06-14 NOTE — ED Provider Notes (Addendum)
HPI Comments: Patient is a 25 yo female with swelling in her ankles bilaterally.  States swelling has been present for 2-3 weeks, states bilaterally with pain in both feet.  States swelling slightly more on right top of foot, however no swelling to legs posteriorly.  No chest pain, no SOB, no abdominal pain, no nausea or vomiting, states unknown if she is pregnant.  No history of liver, kidney or heart disease.    Patient is a 25 y.o. female presenting with ankle swelling. The history is provided by the patient. No language interpreter was used.   Ankle swelling    Pertinent negatives include no back pain and no neck pain.        Past Medical History:   Diagnosis Date   ??? Asthma        Past Surgical History:   Procedure Laterality Date   ??? HX GYN      c/s         History reviewed. No pertinent family history.    Social History     Social History   ??? Marital status: SINGLE     Spouse name: N/A   ??? Number of children: N/A   ??? Years of education: N/A     Occupational History   ??? Not on file.     Social History Main Topics   ??? Smoking status: Current Every Day Smoker     Packs/day: 0.50     Last attempt to quit: 08/30/2009   ??? Smokeless tobacco: Never Used   ??? Alcohol use No   ??? Drug use: No   ??? Sexual activity: Yes     Birth control/ protection: None     Other Topics Concern   ??? Not on file     Social History Narrative         ALLERGIES: Review of patient's allergies indicates no known allergies.    Review of Systems   Constitutional: Negative for chills and fever.   HENT: Negative for rhinorrhea and sore throat.    Eyes: Negative for visual disturbance.   Respiratory: Negative for cough and shortness of breath.    Cardiovascular: Positive for leg swelling. Negative for chest pain.   Gastrointestinal: Negative for abdominal pain, diarrhea, nausea and vomiting.   Genitourinary: Negative for dysuria.   Musculoskeletal: Positive for myalgias. Negative for back pain and neck pain.   Skin: Negative for rash.    Neurological: Negative for weakness and headaches.   Psychiatric/Behavioral: The patient is not nervous/anxious.        Vitals:    06/14/15 1753   BP: 136/75   Pulse: 86   Resp: 16   Temp: 99 ??F (37.2 ??C)   SpO2: 100%   Weight: 115.7 kg (255 lb)   Height: '5\' 8"'  (1.727 m)            Physical Exam   Constitutional: She is oriented to person, place, and time. She appears well-developed and well-nourished.   HENT:   Head: Normocephalic.   Right Ear: External ear normal.   Left Ear: External ear normal.   Eyes: Conjunctivae and EOM are normal. Pupils are equal, round, and reactive to light.   Neck: Normal range of motion. Neck supple. No tracheal deviation present.   Cardiovascular: Normal rate, regular rhythm, normal heart sounds and intact distal pulses.    No murmur heard.  Pulmonary/Chest: Effort normal and breath sounds normal. No respiratory distress.   Abdominal: Soft. There is no tenderness.  Musculoskeletal: Normal range of motion.   DP pulse 2+ bilateral lower extremities.  No pain or swelling to calves posteriorly, mild pain to palpation over bilateral dorsal ankle/foot.  Full ROM, ambulates in ED without difficulty.   Neurological: She is alert and oriented to person, place, and time. No cranial nerve deficit.   Skin: No rash noted.   Nursing note and vitals reviewed.       MDM  Number of Diagnoses or Management Options  Bilateral swelling of feet and ankles: new and requires workup     Amount and/or Complexity of Data Reviewed  Clinical lab tests: ordered and reviewed  Tests in the radiology section of CPT??: ordered and reviewed  Review and summarize past medical records: yes    Risk of Complications, Morbidity, and/or Mortality  Presenting problems: moderate  Diagnostic procedures: moderate  Management options: moderate    Patient Progress  Patient progress: stable    ED Course       Procedures    Recent Results (from the past 12 hour(s))   URINE MICROSCOPIC    Collection Time: 06/14/15  7:04 PM    Result Value Ref Range    WBC 10-20 0 /hpf    RBC 3-5 0 /hpf    Epithelial cells 20-50 0 /hpf    Bacteria 3+ (H) 0 /hpf    Casts 0 0 /lpf    Crystals, urine 0 0 /LPF    Mucus 0 0 /lpf    Trichomonas MARKED      Other observations RESULTS VERIFIED MANUALLY     HCG URINE, QL. - POC    Collection Time: 06/14/15  7:09 PM   Result Value Ref Range    Pregnancy test,urine (POC) NEGATIVE  NEG     EKG, 12 LEAD, INITIAL    Collection Time: 06/14/15  7:11 PM   Result Value Ref Range    Ventricular Rate 80 BPM    Atrial Rate 80 BPM    P-R Interval 134 ms    QRS Duration 70 ms    Q-T Interval 362 ms    QTC Calculation (Bezet) 417 ms    Calculated P Axis 48 degrees    Calculated R Axis 40 degrees    Calculated T Axis 45 degrees    Diagnosis       !! AGE AND GENDER SPECIFIC ECG ANALYSIS !!  Normal sinus rhythm  Normal ECG  When compared with ECG of 12-May-2014 22:16,  No significant change was found  Confirmed by [facility]  MD ([facility]), MATTHEW G ([id number]) on 06/14/2015 9:24:34 PM     CBC WITH AUTOMATED DIFF    Collection Time: 06/14/15  7:25 PM   Result Value Ref Range    WBC 5.4 4.3 - 11.1 K/uL    RBC 4.40 4.05 - 5.25 M/uL    HGB 12.0 11.7 - 15.4 g/dL    HCT 36.5 35.8 - 46.3 %    MCV 83.0 79.6 - 97.8 FL    MCH 27.3 26.1 - 32.9 PG    MCHC 32.9 31.4 - 35.0 g/dL    RDW 12.6 11.9 - 14.6 %    PLATELET 234 150 - 450 K/uL    MPV 10.2 (L) 10.8 - 14.1 FL    DF AUTOMATED      NEUTROPHILS 52 43 - 78 %    LYMPHOCYTES 38 13 - 44 %    MONOCYTES 7 4.0 - 12.0 %    EOSINOPHILS 3 0.5 - 7.8 %  BASOPHILS 0 0.0 - 2.0 %    IMMATURE GRANULOCYTES 0.4 0.0 - 5.0 %    ABS. NEUTROPHILS 2.8 1.7 - 8.2 K/UL    ABS. LYMPHOCYTES 2.1 0.5 - 4.6 K/UL    ABS. MONOCYTES 0.4 0.1 - 1.3 K/UL    ABS. EOSINOPHILS 0.2 0.0 - 0.8 K/UL    ABS. BASOPHILS 0.0 0.0 - 0.2 K/UL    ABS. IMM. GRANS. 0.0 0.0 - 0.5 K/UL   METABOLIC PANEL, COMPREHENSIVE    Collection Time: 06/14/15  7:25 PM   Result Value Ref Range    Sodium 143 136 - 145 mmol/L    Potassium 4.0 3.5 - 5.1 mmol/L     Chloride 107 98 - 107 mmol/L    CO2 31 21 - 32 mmol/L    Anion gap 5 (L) 7 - 16 mmol/L    Glucose 68 65 - 100 mg/dL    BUN 11 6 - 23 MG/DL    Creatinine 0.84 0.6 - 1.0 MG/DL    GFR est AA >60 >60 ml/min/1.51m    GFR est non-AA >60 >60 ml/min/1.735m   Calcium 8.1 (L) 8.3 - 10.4 MG/DL    Bilirubin, total <0.1 (L) 0.2 - 1.1 MG/DL    ALT (SGPT) 22 12 - 65 U/L    AST (SGOT) 18 15 - 37 U/L    Alk. phosphatase 78 50 - 136 U/L    Protein, total 6.9 6.3 - 8.2 g/dL    Albumin 2.9 (L) 3.5 - 5.0 g/dL    Globulin 4.0 (H) 2.3 - 3.5 g/dL    A-G Ratio 0.7 (L) 1.2 - 3.5     BNP    Collection Time: 06/14/15  7:25 PM   Result Value Ref Range    BNP 9 pg/mL     Xr Ankle Rt Min 3 V    Result Date: 06/14/2015  History: Right ankle pain, moderate, with swelling, 2 weeks duration Right ankle series FINDINGS: 3 views of the right ankle demonstrate no acute fracture, dislocation, or additional bony abnormality.     IMPRESSION: No acute findings.    Xr Ankle Lt Min 3 V    Result Date: 06/14/2015  History: Left ankle pain, moderate, 2 weeks duration 3 views left ankle FINDINGS: No acute fracture, dislocation, or additional bony abnormality demonstrated.     IMPRESSION: No acute findings.      [age]o female with ankle/feet swelling:         Patient eloped without informing anyone while I was managing critically ill patient.  I was not informed she had left until after she had left the department.  She did have IV removed.  She has signs of UTI, I will attempt to contact her to prescribe abx.          2:26 AM Update- attempted to contact patient who did not answer phone and her voicemail is not set up.  Prescription printed and left in ED.  Will attempt to call again in AM.

## 2015-06-14 NOTE — ED Notes (Addendum)
Patient demanded IV access be taken out, she states is is going to leave without discharge instructions at this time. RN asked patient to stay as the provider is with another patient. Patient states she will wait a little longer. MD notified.

## 2015-06-14 NOTE — ED Notes (Signed)
Pt resting on stretcher. In NAD at this time.

## 2015-06-15 LAB — EKG, 12 LEAD, INITIAL
Atrial Rate: 80 {beats}/min
Calculated P Axis: 48 degrees
Calculated R Axis: 40 degrees
Calculated T Axis: 45 degrees
P-R Interval: 134 ms
Q-T Interval: 362 ms
QRS Duration: 70 ms
QTC Calculation (Bezet): 417 ms
Ventricular Rate: 80 {beats}/min

## 2015-06-15 LAB — METABOLIC PANEL, COMPREHENSIVE
A-G Ratio: 0.7 — ABNORMAL LOW (ref 1.2–3.5)
ALT (SGPT): 22 U/L (ref 12–65)
AST (SGOT): 18 U/L (ref 15–37)
Albumin: 2.9 g/dL — ABNORMAL LOW (ref 3.5–5.0)
Alk. phosphatase: 78 U/L (ref 50–136)
Anion gap: 5 mmol/L — ABNORMAL LOW (ref 7–16)
BUN: 11 MG/DL (ref 6–23)
Bilirubin, total: <0.1 MG/DL — ABNORMAL LOW (ref 0.2–1.1)
CO2: 31 mmol/L (ref 21–32)
Calcium: 8.1 MG/DL — ABNORMAL LOW (ref 8.3–10.4)
Chloride: 107 mmol/L (ref 98–107)
Creatinine: 0.84 MG/DL (ref 0.6–1.0)
GFR est AA: >60 mL/min/{1.73_m2} (ref >60)
GFR est non-AA: >60 mL/min/{1.73_m2} (ref >60)
Globulin: 4 g/dL — ABNORMAL HIGH (ref 2.3–3.5)
Glucose: 68 mg/dL (ref 65–100)
Potassium: 4 mmol/L (ref 3.5–5.1)
Protein, total: 6.9 g/dL (ref 6.3–8.2)
Sodium: 143 mmol/L (ref 136–145)

## 2015-06-15 LAB — BNP: BNP: 9 pg/mL

## 2015-06-15 LAB — EKG 12-LEAD
Atrial Rate: 80 {beats}/min
P Axis: 48 degrees
P-R Interval: 134 ms
Q-T Interval: 362 ms
QRS Duration: 70 ms
QTc Calculation (Bazett): 417 ms
R Axis: 40 degrees
T Axis: 45 degrees
Ventricular Rate: 80 {beats}/min

## 2015-06-15 MED ORDER — CIPROFLOXACIN 500 MG TAB
500 mg | ORAL_TABLET | Freq: Two times a day (BID) | ORAL | 0 refills | Status: AC
Start: 2015-06-15 — End: 2015-06-22

## 2016-07-06 ENCOUNTER — Emergency Department (HOSPITAL_BASED_OUTPATIENT_CLINIC_OR_DEPARTMENT_OTHER)
Admission: EM | Admit: 2016-07-06 | Discharge: 2016-07-06 | Disposition: A | Discharge status: Home / Self Care | Payer: Self-pay | Attending: Emergency Medicine | Admitting: Emergency Medicine

## 2016-07-06 ENCOUNTER — Encounter (HOSPITAL_BASED_OUTPATIENT_CLINIC_OR_DEPARTMENT_OTHER): Payer: Self-pay | Admitting: Emergency Medicine

## 2016-07-06 ENCOUNTER — Emergency Department (HOSPITAL_BASED_OUTPATIENT_CLINIC_OR_DEPARTMENT_OTHER): Payer: Self-pay

## 2016-07-06 DIAGNOSIS — J45909 Unspecified asthma, uncomplicated: Secondary | ICD-10-CM | POA: Insufficient documentation

## 2016-07-06 DIAGNOSIS — F410 Panic disorder [episodic paroxysmal anxiety] without agoraphobia: Secondary | ICD-10-CM | POA: Insufficient documentation

## 2016-07-06 DIAGNOSIS — R0789 Other chest pain: Secondary | ICD-10-CM | POA: Insufficient documentation

## 2016-07-06 DIAGNOSIS — F17210 Nicotine dependence, cigarettes, uncomplicated: Secondary | ICD-10-CM | POA: Insufficient documentation

## 2016-07-06 HISTORY — DX: Unspecified asthma, uncomplicated: J45.909

## 2016-07-06 MED ORDER — ACETAMINOPHEN 325 MG PO TABS
650.0000 mg | ORAL_TABLET | Freq: Once | ORAL | Status: AC
  Administered 2016-07-06: 650 mg via ORAL
  Filled 2016-07-06: qty 2

## 2016-07-06 NOTE — Discharge Instructions (Signed)
Please read and follow all provided instructions.  Your diagnoses today include:  1. Panic attack   2. Chest tightness     Tests performed today include:  EKG  Vital signs. See below for your results today.   Medications prescribed:   None  Take any prescribed medications only as directed.  Home care instructions:  Follow any educational materials contained in this packet.  BE VERY CAREFUL not to take multiple medicines containing Tylenol (also called acetaminophen). Doing so can lead to an overdose which can damage your liver and cause liver failure and possibly death.   Follow-up instructions: Please follow-up with your primary care provider in the next 3 days for further evaluation of your symptoms.   Return instructions:   Please return to the Emergency Department if you experience worsening symptoms.   Return with worsening chest pain, persistent shortness of breath.  Please return if you have any other emergent concerns.  Additional Information:  Your vital signs today were: BP 114/85    Pulse (!) 108    Temp 99 F (37.2 C) (Oral)    Resp (!) 23    Ht 5\' 8"  (1.727 m)    Wt 106.6 kg (235 lb)    LMP 06/08/2016    SpO2 100%    BMI 35.73 kg/m  If your blood pressure (BP) was elevated above 135/85 this visit, please have this repeated by your doctor within one month. --------------

## 2016-07-06 NOTE — ED Provider Notes (Signed)
[facility] Provider Note   CSN: [id number] Arrival date & time: 07/06/16  1154     History   Chief Complaint Chief Complaint  Patient presents with  . Shortness of Breath    HPI Rachel Pena is a 26 y.o. female.  Patient presents today with complaint of chest tightness and shortness of breath. Patient has a history of asthma. She states that earlier this afternoon she experienced a stressful event and became upset which triggered her symptoms. She did have some wheezing and states that she had "a panic attack". She became more calm after approximately 30 minutes. She did not have an inhaler to use. She presents to the emergency department with persistent tightness in her chest, although she states that her symptoms are much improved from earlier. No fevers or cough. No vomiting or diarrhea. Patient denies risk factors for pulmonary embolism including: unilateral leg swelling, history of DVT/PE/other blood clots, use of exogenous hormones, recent immobilizations, recent surgery, recent travel (>4hr segment), malignancy, hemoptysis. No lightheadedness or syncope.       Past Medical History:  Diagnosis Date  . Asthma     There are no active problems to display for this patient.   Past Surgical History:  Procedure Laterality Date  . CESAREAN SECTION      OB History    No data available       Home Medications    Prior to Admission medications   Not on File    Family History No family history on file.  Social History Social History  Substance Use Topics  . Smoking status: Current Every Day Smoker  . Smokeless tobacco: Never Used  . Alcohol use Yes     Allergies   Patient has no known allergies.   Review of Systems Review of Systems  Constitutional: Negative for fever.  HENT: Negative for rhinorrhea and sore throat.   Eyes: Negative for redness.  Respiratory: Positive for chest tightness and shortness of breath. Negative for cough.     Cardiovascular: Negative for chest pain.  Gastrointestinal: Negative for abdominal pain, diarrhea, nausea and vomiting.  Genitourinary: Negative for dysuria.  Musculoskeletal: Negative for myalgias.  Skin: Negative for rash.  Neurological: Positive for headaches.  Psychiatric/Behavioral: The patient is nervous/anxious.      Physical Exam Updated Vital Signs BP 114/85   Pulse (!) 108   Temp 99 F (37.2 C) (Oral)   Resp (!) 23   Ht 5\' 8"  (1.727 m)   Wt 106.6 kg (235 lb)   LMP 06/08/2016   SpO2 100%   BMI 35.73 kg/m   Physical Exam  Constitutional: She appears well-developed and well-nourished.  HENT:  Head: Normocephalic and atraumatic.  Mouth/Throat: Oropharynx is clear and moist.  Eyes: Conjunctivae are normal. Right eye exhibits no discharge. Left eye exhibits no discharge.  Neck: Normal range of motion. Neck supple.  Cardiovascular: Regular rhythm and normal heart sounds.  Tachycardia present.  Exam reveals no gallop and no friction rub.   No murmur heard. Slight tachycardia.  Pulmonary/Chest: Effort normal and breath sounds normal. No respiratory distress. She has no wheezes. She has no rales.  Abdominal: Soft. There is no tenderness.  Musculoskeletal: She exhibits no edema or tenderness.  Neurological: She is alert.  Skin: Skin is warm and dry.  Psychiatric: She has a normal mood and affect.  Nursing note and vitals reviewed.    ED Treatments / Results   ED ECG REPORT   Date: 07/06/2016  Rate: 103  Rhythm: sinus tachycardia  QRS Axis: normal  Intervals: normal  ST/T Wave abnormalities: normal  Conduction Disutrbances:none  Narrative Interpretation:   Old EKG Reviewed: none available  I have personally reviewed the EKG tracing and agree with the computerized printout as noted.  Radiology Dg Chest 2 View  Result Date: 07/06/2016 CLINICAL DATA:  Shortness breath and chest pain since this morning. EXAM: CHEST  2 VIEW COMPARISON:  None. FINDINGS: The  heart size and mediastinal contours are within normal limits. Both lungs are clear. The visualized skeletal structures are unremarkable. IMPRESSION: No active cardiopulmonary disease. Electronically Signed   By: Elberta Fortisaniel  Boyle M.D.   On: 07/06/2016 12:42    Procedures Procedures (including critical care time)  Medications Ordered in ED Medications  acetaminophen (TYLENOL) tablet 650 mg (not administered)     Initial Impression / Assessment and Plan / ED Course  I have reviewed the triage vital signs and the nursing notes.  Pertinent labs & imaging results that were available during my care of the patient were reviewed by me and considered in my medical decision making (see chart for details).     Patient seen and examined. Will check EKG and chest x-ray. Suspect anxiety. No wheezing at time of exam.  Vital signs reviewed and are as follows: BP 114/85   Pulse (!) 108   Temp 99 F (37.2 C) (Oral)   Resp (!) 23   Ht 5\' 8"  (1.727 m)   Wt 106.6 kg (235 lb)   LMP 06/08/2016   SpO2 100%   BMI 35.73 kg/m   1:34 PM patient updated on results. She is feeling better except for a headache. Tylenol ordered. Will discharge to home. Encouraged rest and relaxation for the remainder of today. Return with persistent chest pain, worsening shortness of breath, new symptoms or other concerns. She verbalizes understanding and agrees with plan.  Final Clinical Impressions(s) / ED Diagnoses   Final diagnoses:  Panic attack  Chest tightness   Patient with chest tightness post panic attack. No signs of asthma on emergency department arrival. Chest x-ray is clear. EKG shows mild tachycardia. I do not suspect PE given lack of risk factors and emotional inciting event.  New Prescriptions New Prescriptions   No medications on file     Renne CriglerGeiple, Trinitie Mcgirr, Cordelia Poche-C 07/06/16 1335    Alvira MondaySchlossman, Erin, MD 07/10/16 1407

## 2016-07-06 NOTE — ED Triage Notes (Signed)
Pt states she had a panic attack this morning and is now SOB and having chest pain. Pt does not take meds for anxiety.

## 2016-07-06 NOTE — ED Notes (Signed)
Patient denies pain and is resting comfortably.  

## 2016-07-06 NOTE — ED Notes (Signed)
Pt on cardiac monitor and auto VS 

## 2016-07-21 DIAGNOSIS — J45909 Unspecified asthma, uncomplicated: Secondary | ICD-10-CM | POA: Insufficient documentation

## 2016-07-21 DIAGNOSIS — L509 Urticaria, unspecified: Secondary | ICD-10-CM | POA: Insufficient documentation

## 2016-07-21 DIAGNOSIS — F17200 Nicotine dependence, unspecified, uncomplicated: Secondary | ICD-10-CM | POA: Insufficient documentation

## 2016-07-22 ENCOUNTER — Encounter (HOSPITAL_BASED_OUTPATIENT_CLINIC_OR_DEPARTMENT_OTHER): Payer: Self-pay | Admitting: Emergency Medicine

## 2016-07-22 ENCOUNTER — Emergency Department (HOSPITAL_BASED_OUTPATIENT_CLINIC_OR_DEPARTMENT_OTHER)
Admission: EM | Admit: 2016-07-22 | Discharge: 2016-07-22 | Disposition: A | Discharge status: Home / Self Care | Payer: Self-pay | Attending: Emergency Medicine | Admitting: Emergency Medicine

## 2016-07-22 DIAGNOSIS — L509 Urticaria, unspecified: Secondary | ICD-10-CM

## 2016-07-22 MED ORDER — FAMOTIDINE 20 MG PO TABS: 20.0000 mg | ORAL_TABLET | Freq: Two times a day (BID) | ORAL | 0 refills | Status: DC

## 2016-07-22 MED ORDER — DEXAMETHASONE SODIUM PHOSPHATE 10 MG/ML IJ SOLN
10.0000 mg | Freq: Once | INTRAMUSCULAR | Status: AC
  Administered 2016-07-22: 10 mg via INTRAMUSCULAR
  Filled 2016-07-22: qty 1

## 2016-07-22 MED ORDER — FAMOTIDINE 20 MG PO TABS
20.0000 mg | ORAL_TABLET | Freq: Once | ORAL | Status: AC
  Administered 2016-07-22: 20 mg via ORAL
  Filled 2016-07-22: qty 1

## 2016-07-22 MED ORDER — DIPHENHYDRAMINE HCL 25 MG PO CAPS: 25.0000 mg | ORAL_CAPSULE | Freq: Four times a day (QID) | ORAL | 0 refills | Status: DC | PRN

## 2016-07-22 MED ORDER — DIPHENHYDRAMINE HCL 25 MG PO CAPS
25.0000 mg | ORAL_CAPSULE | Freq: Once | ORAL | Status: AC
  Administered 2016-07-22: 25 mg via ORAL
  Filled 2016-07-22: qty 1

## 2016-07-22 MED ORDER — ACETAMINOPHEN 325 MG PO TABS
650.0000 mg | ORAL_TABLET | Freq: Once | ORAL | Status: AC
  Administered 2016-07-22: 650 mg via ORAL
  Filled 2016-07-22: qty 2

## 2016-07-22 NOTE — ED Triage Notes (Signed)
Patient states that about and hour and half ago she started to have itching to her back. The patient reports that it is now all over her body itching and burning. The patient reports that her joints ache like she has the flu.

## 2016-07-22 NOTE — Discharge Instructions (Signed)
Avoid any scented products. Use hypoallergenic soap and detergent. Take benadryl and pepcid as prescribed. Follow up with primary care doctor. Return if worsening symptoms.

## 2016-07-22 NOTE — ED Provider Notes (Signed)
[facility]   CSN: [id number] Arrival date & time: 07/21/16  2337     History   Chief Complaint Chief Complaint  Patient presents with  . Rash    HPI Rachel Pena is a 26 y.o. female.  HPI Rachel Pena is a 26 y.o. female with a history of asthma, prior allergic reactions, presents to emergency department complaining of rash to the back, itching to the face, hair, ears, lips, and having body aches. This symptoms began about an hour prior to coming in. She states she was already in bed for several hours when they started. She denies any new products. No new lotions, soaps, no new detergent. She denies any new clothing. She denies any new food. She states she ate at a [place] where she normally eats this evening. No new drinks. She reports history of hives in the past, states normally gets some on her face. She has had to have allergy shots in the past. She did not take any medications but came straight here. She denies any swelling to the lips, tongue, oropharynx. Denies any swelling to the ears. She reports associated body aches and joint aches. Denies any upper respiratory symptoms. No cough or congestion. No nausea, vomiting, diarrhea. Past Medical History:  Diagnosis Date  . Asthma     There are no active problems to display for this patient.   Past Surgical History:  Procedure Laterality Date  . CESAREAN SECTION      OB History    No data available       Home Medications    Prior to Admission medications   Not on File    Family History History reviewed. No pertinent family history.  Social History Social History  Substance Use Topics  . Smoking status: Current Every Day Smoker  . Smokeless tobacco: Never Used  . Alcohol use Yes     Allergies   Patient has no known allergies.   Review of Systems Review of Systems  Constitutional: Negative for chills and fever.  Respiratory: Negative for cough, chest  tightness and shortness of breath.   Cardiovascular: Negative for chest pain, palpitations and leg swelling.  Gastrointestinal: Negative for abdominal pain, diarrhea, nausea and vomiting.  Genitourinary: Negative for dysuria, flank pain, pelvic pain, vaginal bleeding, vaginal discharge and vaginal pain.  Musculoskeletal: Positive for arthralgias and myalgias. Negative for neck pain and neck stiffness.  Skin: Positive for rash.  Neurological: Negative for dizziness, weakness and headaches.  All other systems reviewed and are negative.    Physical Exam Updated Vital Signs BP 120/81 (BP Location: Right Arm)   Pulse 74   Temp 99.5 F (37.5 C) (Oral)   Resp 17   Ht 5\' 7"  (1.702 m)   Wt 105.2 kg (232 lb)   LMP 07/07/2016   SpO2 99%   BMI 36.34 kg/m   Physical Exam  Constitutional: She is oriented to person, place, and time. She appears well-developed and well-nourished. No distress.  HENT:  Head: Normocephalic.  No swelling of the lips, tongue, oropharynx. Uvula is midline, normal. No oral lesions.  Eyes: Conjunctivae are normal.  Neck: Neck supple.  Cardiovascular: Normal rate, regular rhythm and normal heart sounds.   Pulmonary/Chest: Effort normal and breath sounds normal. No respiratory distress. She has no wheezes. She has no rales.  Abdominal: Soft. Bowel sounds are normal. She exhibits no distension. There is no tenderness. There is no rebound.  Musculoskeletal: She exhibits no edema.  Neurological: She  is alert and oriented to person, place, and time.  Skin: Skin is warm and dry.  Erythematous scattered hives to the lower back. Normal skin otherwise.  Psychiatric: She has a normal mood and affect. Her behavior is normal.  Nursing note and vitals reviewed.    ED Treatments / Results  Labs (all labs ordered are listed, but only abnormal results are displayed) Labs Reviewed - No data to display  EKG  EKG Interpretation None       Radiology No results  found.  Procedures Procedures (including critical care time)  Medications Ordered in ED Medications  dexamethasone (DECADRON) injection 10 mg (not administered)  famotidine (PEPCID) tablet 20 mg (not administered)  acetaminophen (TYLENOL) tablet 650 mg (not administered)     Initial Impression / Assessment and Plan / ED Course  I have reviewed the triage vital signs and the nursing notes.  Pertinent labs & imaging results that were available during my care of the patient were reviewed by me and considered in my medical decision making (see chart for details).    patient in emergency department with hives to the back, itching of the face, scalp, lips, ears. No evidence of angioedema. No difficulty breathing. Lung sounds are normal. She reports history of similar reactions in the past. She denies any known triggers today. Will treat with Decadron, Pepcid.  Advised to take Benadryl Pepcid in the next several days to prevent rebound hives. Return if worsening symptoms, otherwise follow up with pcp.   Vitals:   07/21/16 2355  BP: 120/81  Pulse: 74  Resp: 17  Temp: 99.5 F (37.5 C)  TempSrc: Oral  SpO2: 99%  Weight: 105.2 kg (232 lb)  Height: 5\' 7"  (1.702 m)     Final Clinical Impressions(s) / ED Diagnoses   Final diagnoses:  Urticaria    New Prescriptions New Prescriptions   DIPHENHYDRAMINE (BENADRYL) 25 MG CAPSULE    Take 1 capsule (25 mg total) by mouth every 6 (six) hours as needed.   FAMOTIDINE (PEPCID) 20 MG TABLET    Take 1 tablet (20 mg total) by mouth 2 (two) times daily.     Jaynie CrumbleKirichenko, Delayla Hoffmaster, PA-C 07/22/16 0101    Paula LibraMolpus, John, MD 07/22/16 [phone]

## 2016-07-22 NOTE — ED Notes (Signed)
Denies questions or needs, rushing d/c process, has not gotten off of phone since arrival, NAD, no dyspnea noted, no scratching, voice clear, steady gait.

## 2016-07-22 NOTE — ED Notes (Signed)
EDPA into room, prior to RN assessment, see PA notes, orders received to medicate and d/c. Care assumed at time of d/c.   Alert, NAD, calm, interactive, resps e/u, speaking in clear complete sentences, no dyspnea noted, skin W&D, VSS, c/o itching, rash, and some pain, (denies: fever, sob, swelling, nv, bruising, or dizziness).   Pt arguing on phone.

## 2016-09-04 ENCOUNTER — Encounter (HOSPITAL_BASED_OUTPATIENT_CLINIC_OR_DEPARTMENT_OTHER): Payer: Self-pay

## 2016-09-04 ENCOUNTER — Emergency Department (HOSPITAL_BASED_OUTPATIENT_CLINIC_OR_DEPARTMENT_OTHER)
Admission: EM | Admit: 2016-09-04 | Discharge: 2016-09-05 | Disposition: A | Discharge status: Home / Self Care | Payer: Self-pay | Attending: Emergency Medicine | Admitting: Emergency Medicine

## 2016-09-04 DIAGNOSIS — W57XXXA Bitten or stung by nonvenomous insect and other nonvenomous arthropods, initial encounter: Secondary | ICD-10-CM | POA: Insufficient documentation

## 2016-09-04 DIAGNOSIS — L0291 Cutaneous abscess, unspecified: Secondary | ICD-10-CM

## 2016-09-04 DIAGNOSIS — F17200 Nicotine dependence, unspecified, uncomplicated: Secondary | ICD-10-CM | POA: Insufficient documentation

## 2016-09-04 DIAGNOSIS — Y929 Unspecified place or not applicable: Secondary | ICD-10-CM | POA: Insufficient documentation

## 2016-09-04 DIAGNOSIS — Y939 Activity, unspecified: Secondary | ICD-10-CM | POA: Insufficient documentation

## 2016-09-04 DIAGNOSIS — Y999 Unspecified external cause status: Secondary | ICD-10-CM | POA: Insufficient documentation

## 2016-09-04 DIAGNOSIS — J45909 Unspecified asthma, uncomplicated: Secondary | ICD-10-CM | POA: Insufficient documentation

## 2016-09-04 DIAGNOSIS — L02415 Cutaneous abscess of right lower limb: Secondary | ICD-10-CM | POA: Insufficient documentation

## 2016-09-04 MED ORDER — LIDOCAINE-EPINEPHRINE (PF) 2 %-1:200000 IJ SOLN
10.0000 mL | Freq: Once | INTRAMUSCULAR | Status: AC
  Administered 2016-09-04: 10 mL
  Filled 2016-09-04: qty 10

## 2016-09-04 NOTE — ED Notes (Signed)
No answer when called for a room. 

## 2016-09-04 NOTE — ED Notes (Signed)
No answer when called for room 

## 2016-09-04 NOTE — ED Notes (Signed)
Pt has now returned to lobby. Will call when bed is available.

## 2016-09-04 NOTE — ED Triage Notes (Signed)
C/o insect bite to right lower LE last night-NAD-steady gait

## 2016-09-05 MED ORDER — CEPHALEXIN 500 MG PO CAPS: 500.0000 mg | ORAL_CAPSULE | Freq: Four times a day (QID) | ORAL | 0 refills | Status: AC

## 2016-09-05 NOTE — Discharge Instructions (Signed)
Take antibiotics as prescribed. Use IV heparin or Tylenol as needed for pain. Leave the dressing on for 24 hours. After that, you may gently wash the area with soap and water and reapply dressing. Return to the emergency room if you develop fever, chills, worsening pain, increase swelling/redness, or any new or worsening symptoms.

## 2016-09-05 NOTE — ED Provider Notes (Signed)
[facility]   CSN: [id number] Arrival date & time: 09/04/16  2031     History   Chief Complaint Chief Complaint  Patient presents with  . Insect Bite    HPI Rachel Pena is a 26 y.o. female presenting with pain of R shin.  Pt states that starting today, she noticed pain of the anterior R shin. She is concerned she has an insect bite. Pain has been steadily growing throughout the day. Pain worse with walking or touching. She has not taken anything for pain. Nothing makes the pain better. She states pain is constant. She denies fevers, chill, nausea, vomiting, pain elsewhere, numbness or tingling. She has had a tetanus shot in the past 10 years. She does not take blood thinners.   HPI  Past Medical History:  Diagnosis Date  . Asthma     There are no active problems to display for this patient.   Past Surgical History:  Procedure Laterality Date  . CESAREAN SECTION      OB History    No data available       Home Medications    Prior to Admission medications   Medication Sig Start Date End Date Taking? Authorizing Provider  cephALEXin (KEFLEX) 500 MG capsule Take 1 capsule (500 mg total) by mouth 4 (four) times daily. 09/05/16 09/10/16  Roger Fasnacht, PA-C    Family History No family history on file.  Social History Social History  Substance Use Topics  . Smoking status: Current Every Day Smoker  . Smokeless tobacco: Never Used  . Alcohol use Yes     Comment: occ     Allergies   Patient has no known allergies.   Review of Systems Review of Systems  Skin:       Painful lesion of R shin  Neurological: Negative for numbness.  Hematological: Does not bruise/bleed easily.     Physical Exam Updated Vital Signs BP 113/73   Pulse 66   Temp 98.9 F (37.2 C) (Oral)   Resp 18   Ht 5\' 7"  (1.702 m)   Wt 105 kg (231 lb 7.7 oz)   LMP 09/03/2016   SpO2 99%   BMI 36.26 kg/m   Physical Exam  Constitutional: She is  oriented to person, place, and time. She appears well-developed and well-nourished. No distress.  HENT:  Head: Normocephalic and atraumatic.  Eyes: EOM are normal.  Neck: Normal range of motion.  Pulmonary/Chest: Effort normal.  Abdominal: She exhibits no distension.  Musculoskeletal: Normal range of motion.  Neurological: She is alert and oriented to person, place, and time.  Skin: Skin is warm. No rash noted.  Pt with erythema of R anterior shin midway between ankle and knee. Erythema is 2 in x 2 in with single 1 mm pustular head in the middle. Area is warm. No other lesions noted.   Psychiatric: She has a normal mood and affect.  Nursing note and vitals reviewed.    ED Treatments / Results  Labs (all labs ordered are listed, but only abnormal results are displayed) Labs Reviewed - No data to display  EKG  EKG Interpretation None       Radiology No results found.   EMERGENCY DEPARTMENT US SOFT TISSUE INTERPRETATION "Study: Limited Soft Tissue Ultrasound"  INDICATIONS: Pain and Soft tissue infection Multiple views of the body part were obtained in real-time with a multi-frequency linear probe  PERFORMED BY: Myself IMAGES ARCHIVED?: No SIDE:Right  BODY PART:Lower extremity INTERPRETATION:  Abcess present and Cellulitis present     Procedures .Marland KitchenIncision and Drainage Date/Time: 09/05/2016 12:27 AM Performed by: Alveria Apley Authorized by: Alveria Apley   Consent:    Consent obtained:  Verbal   Consent given by:  Patient   Risks discussed:  Bleeding, incomplete drainage, pain and infection   Alternatives discussed:  No treatment and observation Location:    Type:  Abscess   Location:  Lower extremity   Lower extremity location:  Leg Pre-procedure details:    Skin preparation:  Betadine Anesthesia (see MAR for exact dosages):    Anesthesia method:  Local infiltration   Local anesthetic:  Lidocaine 2% WITH epi Procedure type:    Complexity:   Simple Procedure details:    Incision types:  Stab incision   Incision depth:  Dermal   Scalpel blade:  11   Wound management:  Probed and deloculated and irrigated with saline   Drainage:  Bloody and purulent   Drainage amount:  Scant   Wound treatment:  Wound left open   Packing materials:  None Post-procedure details:    Patient tolerance of procedure:  Tolerated well, no immediate complications    (including critical care time)  Medications Ordered in ED Medications  lidocaine-EPINEPHrine (XYLOCAINE W/EPI) 2 %-1:200000 (PF) injection 10 mL (10 mLs Infiltration Given 09/04/16 2356)     Initial Impression / Assessment and Plan / ED Course  I have reviewed the triage vital signs and the nursing notes.  Pertinent labs & imaging results that were available during my care of the patient were reviewed by me and considered in my medical decision making (see chart for details).     Pt presenting with 1 day h/o painful area on R shin. Physical exam shows cellulitis with pustular head. US shows small abscess with minimal fluid. Discussed with pt option of attempted conservative tx with warm compresses and abx, or attempting I&D. Discussed possibility of needing to return for growth of abscess. Pt wants to attempt I&D.  Discussed aftercare instructions. abd for surrounding cellulitis. Return precautions given. Pt states she understands and agrees to plan.   Final Clinical Impressions(s) / ED Diagnoses   Final diagnoses:  Abscess    New Prescriptions Discharge Medication List as of 09/05/2016 12:27 AM    START taking these medications   Details  cephALEXin (KEFLEX) 500 MG capsule Take 1 capsule (500 mg total) by mouth 4 (four) times daily., Starting Thu 09/05/2016, Until Tue 09/10/2016, Print         Lawrence, Govani Radloff, PA-C 09/06/16 0032    Molpus, Jonny Ruiz, MD 09/08/16 2235

## 2017-09-12 LAB — HEMOGLOBIN A1C
Estimated Avg Glucose, External: 100 mg/dL (ref 77–114)
Hemoglobin A1C, External: 5.1 % (ref 4.3–5.6)

## 2017-09-30 ENCOUNTER — Inpatient Hospital Stay
Admit: 2017-09-30 | Discharge: 2017-09-30 | Disposition: A | Discharge status: Home / Self Care | Payer: Self-pay | Attending: Emergency Medicine

## 2017-09-30 DIAGNOSIS — J029 Acute pharyngitis, unspecified: Secondary | ICD-10-CM

## 2017-09-30 LAB — STREP AG SCREEN, GROUP A
Group A Strep Ag ID: NEGATIVE
Strep A Ag: NEGATIVE

## 2017-09-30 MED ORDER — AMOXICILLIN 875 MG TAB
875 mg | ORAL_TABLET | Freq: Two times a day (BID) | ORAL | 0 refills | Status: AC
Start: 2017-09-30 — End: 2017-10-10

## 2017-09-30 NOTE — ED Notes (Signed)
Reports sore throat x 1 week.  States prone to strep and tonsil stones.

## 2017-09-30 NOTE — ED Provider Notes (Signed)
ED Provider Notes by Joanie Coddington, PA at 09/30/17 1003                Author: Joanie Coddington, PA  Service: Emergency Medicine  Author Type: Physician Assistant       Filed: 09/30/17 1028  Date of Service: 09/30/17 1003  Status: Attested           Editor: Leonor Liv Charm Barges, PA (Physician Assistant)  Cosigner: Arna Medici, DO at 09/30/17 1653          Attestation signed by Arna Medici, DO at 09/30/17 1653          I was personally available for consultation in the emergency department.  I have reviewed the chart and agree with the documentation recorded by the [facility], including  the assessment, treatment plan, and disposition.   Arna Medici, DO                                    Patient is here with a sore throat, subjective fever, swollen lymph nodes, body aches and not feeling well for  the last 2 days. She does have a little bit of nausea as well but no vomiting.  No headache, neck pain, chest pain, shortness of breath, abdominal pain, swelling of his arms or legs, dizziness, weakness or other symptoms. She was ambulatory to the stretcher  without difficulty and well-hydrated.  No trouble with urination or bowel movements.  Patient states she has a history of tonsillar stones and would like a referral to ENT today.      The history is provided by the patient.    Sore Throat     This is a recurrent problem. The  current episode started 2 days ago. The problem has  been gradually worsening. Patient reports  a subjective fever - was not measured.Associated symptoms include swollen glands and trouble swallowing.  Pertinent negatives include no diarrhea, no vomiting, no congestion, no drooling, no ear discharge, no ear pain, no headaches, no plugged ear sensation, no shortness of breath, no stridor, no stiff neck and no cough. She has had exposure to strep. She  has tried nothing for the symptoms.             Past Medical History:        Diagnosis  Date         ?  Asthma                Past Surgical History:         Procedure  Laterality  Date          ?  HX GYN              c/s             No family history on file.        Social History          Socioeconomic History         ?  Marital status:  SINGLE              Spouse name:  Not on file         ?  Number of children:  Not on file     ?  Years of education:  Not on file     ?  Highest education level:  Not on file  Occupational History        ?  Not on file       Social Needs         ?  Financial resource strain:  Not on file        ?  Food insecurity:              Worry:  Not on file         Inability:  Not on file        ?  Transportation needs:              Medical:  Not on file         Non-medical:  Not on file       Tobacco Use         ?  Smoking status:  Current Every Day Smoker              Packs/day:  0.50         Last attempt to quit:  08/30/2009         Years since quitting:  8.0         ?  Smokeless tobacco:  Never Used       Substance and Sexual Activity         ?  Alcohol use:  No     ?  Drug use:  No     ?  Sexual activity:  Yes              Birth control/protection:  None       Lifestyle        ?  Physical activity:              Days per week:  Not on file         Minutes per session:  Not on file         ?  Stress:  Not on file       Relationships        ?  Social connections:              Talks on phone:  Not on file         Gets together:  Not on file         Attends religious service:  Not on file         Active member of club or organization:  Not on file         Attends meetings of clubs or organizations:  Not on file         Relationship status:  Not on file        ?  Intimate partner violence:              Fear of current or ex partner:  Not on file         Emotionally abused:  Not on file         Physically abused:  Not on file         Forced sexual activity:  Not on file        Other Topics  Concern        ?  Not on file       Social History Narrative        ?  Not on file              ALLERGIES: Patient  has no known allergies.      Review  of Systems    Constitutional: Negative.     HENT: Positive for sore throat and trouble swallowing . Negative for congestion, drooling, ear discharge and ear pain.     Eyes: Negative.     Respiratory: Negative.  Negative for cough, shortness of breath and stridor.     Cardiovascular: Negative.     Gastrointestinal: Negative.  Negative for diarrhea and vomiting.    Genitourinary: Negative.     Musculoskeletal: Negative.     Skin: Negative.     Neurological: Negative.  Negative for headaches.    Psychiatric/Behavioral: Negative.     All other systems reviewed and are negative.           Vitals:          09/30/17 0950        BP:  128/82     Pulse:  82     Resp:  16     Temp:  99 ??F (37.2 ??C)     SpO2:  99%     Weight:  115.7 kg (255 lb)        Height:  5\' 7"  (1.702 m)                Physical Exam    Constitutional: She is oriented to person, place, and time. She appears well-developed and well-nourished.    HENT:    Head: Normocephalic and atraumatic.   Right Ear: External ear normal.   Left Ear: External ear normal.    Nose: Nose normal.    Mouth/Throat: No uvula swelling. Oropharyngeal exudate, posterior oropharyngeal  edema and posterior oropharyngeal erythema present. No tonsillar abscesses. Tonsils  are 1+ on the right. Tonsils are 1+  on the left.    Eyes: Pupils are equal, round, and reactive to light. Conjunctivae and EOM are normal.    Neck: Normal range of motion. Neck supple.    Cardiovascular: Normal rate, regular rhythm, normal heart sounds and intact distal pulses.    Pulmonary/Chest: Effort normal and breath sounds normal.    Abdominal: Soft. Bowel sounds are normal.   Musculoskeletal: Normal range of motion.   Lymphadenopathy:     She has cervical adenopathy.   Neurological: She is alert and oriented to person, place, and time. She has normal reflexes.    Skin: Skin is warm and dry.   Psychiatric: She has a normal mood and affect. Her behavior is normal. Judgment and  thought content  normal.    Nursing note and vitals reviewed.          MDM   Number of Diagnoses or Management Options       Amount and/or Complexity of Data Reviewed   Clinical lab tests: reviewed      Risk of Complications, Morbidity, and/or Mortality   Presenting problems: moderate  Diagnostic procedures: moderate  Management options: moderate     Patient Progress   Patient progress: stable             Procedures      The patient was observed in the ED.      Results Reviewed:           Recent Results (from the past 24 hour(s))     STREP AG SCREEN, GROUP A          Collection Time: 09/30/17  9:51 AM         Result  Value  Ref Range  Group A Strep Ag ID  NEGATIVE   NEG          I will prophylactically treat for strep today based on the appearance of the throat and patient's symptoms. She should finish all the antibiotics as directed, drink plenty of fluids, rest and return to the ED if worse.      I discussed the results of all labs, procedures, radiographs, and treatments with the patient and available family.  Treatment plan is agreed upon and the patient is ready for discharge.   All voiced understanding of the discharge plan and medication instructions or changes as appropriate.  Questions about treatment in the ED were answered.  All were encouraged to return should symptoms worsen or new problems develop.

## 2017-09-30 NOTE — ED Notes (Signed)
I have reviewed discharge instructions with the patient.  The patient verbalized understanding.    Patient left ED via Discharge Method: ambulatory to Home with self.    Opportunity for questions and clarification provided.       Patient given 1 scripts.         To continue your aftercare when you leave the hospital, you may receive an automated call from our care team to check in on how you are doing.  This is a free service and part of our promise to provide the best care and service to meet your aftercare needs." If you have questions, or wish to unsubscribe from this service please call [phone].  Thank you for Choosing our [place] [facility].

## 2017-09-30 NOTE — ED Triage Notes (Signed)
Reports sore throat x 1 week.  States prone to strep and tonsil stones.

## 2017-09-30 NOTE — ED Notes (Signed)
I have reviewed discharge instructions with the patient.  The patient verbalized understanding.    Patient left ED via Discharge Method: ambulatory to Home with self.     Opportunity for questions and clarification provided.       Patient given 1 scripts.         To continue your aftercare when you leave the hospital, you may receive an automated call from our care team to check in on how you are doing.  This is a free service and part of our promise to provide the best care and service to meet your aftercare needs.??? If you have questions, or wish to unsubscribe from this service please call [phone].  Thank you for Choosing our [facility].

## 2017-09-30 NOTE — ED Provider Notes (Signed)
Patient is here with a sore throat, subjective fever, swollen lymph nodes, body aches and not feeling well for the last 2 days. She does have a little bit of nausea as well but no vomiting.  No headache, neck pain, chest pain, shortness of breath, abdominal pain, swelling of his arms or legs, dizziness, weakness or other symptoms. She was ambulatory to the stretcher without difficulty and well-hydrated.  No trouble with urination or bowel movements.  Patient states she has a history of tonsillar stones and would like a referral to ENT today.    The history is provided by the patient.   Sore Throat    This is a recurrent problem. The current episode started 2 days ago. The problem has been gradually worsening. Patient reports a subjective fever - was not measured.Associated symptoms include swollen glands and trouble swallowing. Pertinent negatives include no diarrhea, no vomiting, no congestion, no drooling, no ear discharge, no ear pain, no headaches, no plugged ear sensation, no shortness of breath, no stridor, no stiff neck and no cough. She has had exposure to strep. She has tried nothing for the symptoms.        Past Medical History:   Diagnosis Date   ??? Asthma        Past Surgical History:   Procedure Laterality Date   ??? HX GYN      c/s         No family history on file.    Social History     Socioeconomic History   ??? Marital status: SINGLE     Spouse name: Not on file   ??? Number of children: Not on file   ??? Years of education: Not on file   ??? Highest education level: Not on file   Occupational History   ??? Not on file   Social Needs   ??? Financial resource strain: Not on file   ??? Food insecurity:     Worry: Not on file     Inability: Not on file   ??? Transportation needs:     Medical: Not on file     Non-medical: Not on file   Tobacco Use   ??? Smoking status: Current Every Day Smoker     Packs/day: 0.50     Last attempt to quit: 08/30/2009     Years since quitting: 8.0   ??? Smokeless tobacco: Never Used    Substance and Sexual Activity   ??? Alcohol use: No   ??? Drug use: No   ??? Sexual activity: Yes     Birth control/protection: None   Lifestyle   ??? Physical activity:     Days per week: Not on file     Minutes per session: Not on file   ??? Stress: Not on file   Relationships   ??? Social connections:     Talks on phone: Not on file     Gets together: Not on file     Attends religious service: Not on file     Active member of club or organization: Not on file     Attends meetings of clubs or organizations: Not on file     Relationship status: Not on file   ??? Intimate partner violence:     Fear of current or ex partner: Not on file     Emotionally abused: Not on file     Physically abused: Not on file     Forced sexual activity: Not on file  Other Topics Concern   ??? Not on file   Social History Narrative   ??? Not on file         ALLERGIES: Patient has no known allergies.    Review of Systems   Constitutional: Negative.    HENT: Positive for sore throat and trouble swallowing. Negative for congestion, drooling, ear discharge and ear pain.    Eyes: Negative.    Respiratory: Negative.  Negative for cough, shortness of breath and stridor.    Cardiovascular: Negative.    Gastrointestinal: Negative.  Negative for diarrhea and vomiting.   Genitourinary: Negative.    Musculoskeletal: Negative.    Skin: Negative.    Neurological: Negative.  Negative for headaches.   Psychiatric/Behavioral: Negative.    All other systems reviewed and are negative.      Vitals:    09/30/17 0950   BP: 128/82   Pulse: 82   Resp: 16   Temp: 99 ??F (37.2 ??C)   SpO2: 99%   Weight: 115.7 kg (255 lb)   Height: 5\' 7"  (1.702 m)            Physical Exam   Constitutional: She is oriented to person, place, and time. She appears well-developed and well-nourished.   HENT:   Head: Normocephalic and atraumatic.   Right Ear: External ear normal.   Left Ear: External ear normal.   Nose: Nose normal.   Mouth/Throat: No uvula swelling. Oropharyngeal exudate, posterior  oropharyngeal edema and posterior oropharyngeal erythema present. No tonsillar abscesses. Tonsils are 1+ on the right. Tonsils are 1+ on the left.   Eyes: Pupils are equal, round, and reactive to light. Conjunctivae and EOM are normal.   Neck: Normal range of motion. Neck supple.   Cardiovascular: Normal rate, regular rhythm, normal heart sounds and intact distal pulses.   Pulmonary/Chest: Effort normal and breath sounds normal.   Abdominal: Soft. Bowel sounds are normal.   Musculoskeletal: Normal range of motion.   Lymphadenopathy:     She has cervical adenopathy.   Neurological: She is alert and oriented to person, place, and time. She has normal reflexes.   Skin: Skin is warm and dry.   Psychiatric: She has a normal mood and affect. Her behavior is normal. Judgment and thought content normal.   Nursing note and vitals reviewed.       MDM  Number of Diagnoses or Management Options     Amount and/or Complexity of Data Reviewed  Clinical lab tests: reviewed    Risk of Complications, Morbidity, and/or Mortality  Presenting problems: moderate  Diagnostic procedures: moderate  Management options: moderate    Patient Progress  Patient progress: stable         Procedures    The patient was observed in the ED.    Results Reviewed:      Recent Results (from the past 24 hour(s))   STREP AG SCREEN, GROUP A    Collection Time: 09/30/17  9:51 AM   Result Value Ref Range    Group A Strep Ag ID NEGATIVE  NEG       I will prophylactically treat for strep today based on the appearance of the throat and patient's symptoms. She should finish all the antibiotics as directed, drink plenty of fluids, rest and return to the ED if worse.    I discussed the results of all labs, procedures, radiographs, and treatments with the patient and available family.  Treatment plan is agreed upon and the patient is ready  for discharge.  All voiced understanding of the discharge plan and medication instructions or changes  as appropriate.  Questions about treatment in the ED were answered.  All were encouraged to return should symptoms worsen or new problems develop.

## 2017-10-02 LAB — CULTURE, STREP THROAT

## 2018-03-25 ENCOUNTER — Inpatient Hospital Stay
Admit: 2018-03-25 | Discharge: 2018-03-25 | Disposition: A | Discharge status: Home / Self Care | Payer: Self-pay | Attending: Emergency Medicine

## 2018-03-25 DIAGNOSIS — R220 Localized swelling, mass and lump, head: Secondary | ICD-10-CM

## 2018-03-25 LAB — CBC WITH AUTOMATED DIFF
ABS. BASOPHILS: 0 10*3/uL (ref 0.0–0.2)
ABS. EOSINOPHILS: 0.1 10*3/uL (ref 0.0–0.8)
ABS. IMM. GRANS.: 0 10*3/uL (ref 0.0–0.5)
ABS. LYMPHOCYTES: 1.6 10*3/uL (ref 0.5–4.6)
ABS. MONOCYTES: 0.4 10*3/uL (ref 0.1–1.3)
ABS. NEUTROPHILS: 3.2 10*3/uL (ref 1.7–8.2)
ABSOLUTE NRBC: 0 10*3/uL (ref 0.0–0.2)
BASOPHILS: 0 % (ref 0.0–2.0)
EOSINOPHILS: 3 % (ref 0.5–7.8)
HCT: 40.7 % (ref 35.8–46.3)
HGB: 13.1 g/dL (ref 11.7–15.4)
IMMATURE GRANULOCYTES: 0 % (ref 0.0–5.0)
LYMPHOCYTES: 30 % (ref 13–44)
MCH: 27.7 PG (ref 26.1–32.9)
MCHC: 32.2 g/dL (ref 31.4–35.0)
MCV: 86 FL (ref 79.6–97.8)
MONOCYTES: 7 % (ref 4.0–12.0)
MPV: 10.1 FL (ref 9.4–12.3)
NEUTROPHILS: 60 % (ref 43–78)
PLATELET: 254 10*3/uL (ref 150–450)
RBC: 4.73 M/uL (ref 4.05–5.2)
RDW: 12.8 % (ref 11.9–14.6)
WBC: 5.3 10*3/uL (ref 4.3–11.1)

## 2018-03-25 LAB — METABOLIC PANEL, COMPREHENSIVE
A-G Ratio: 0.7 — ABNORMAL LOW (ref 1.2–3.5)
ALT (SGPT): 22 U/L (ref 12–65)
AST (SGOT): 8 U/L — ABNORMAL LOW (ref 15–37)
Albumin: 3.3 g/dL — ABNORMAL LOW (ref 3.5–5.0)
Alk. phosphatase: 84 U/L (ref 50–130)
Anion gap: 6 mmol/L — ABNORMAL LOW (ref 7–16)
BUN: 10 MG/DL (ref 6–23)
Bilirubin, total: 0.3 MG/DL (ref 0.2–1.1)
CO2: 27 mmol/L (ref 21–32)
Calcium: 8.6 MG/DL (ref 8.3–10.4)
Chloride: 104 mmol/L (ref 98–107)
Creatinine: 0.86 MG/DL (ref 0.6–1.0)
GFR est AA: >60 mL/min/{1.73_m2} (ref >60)
GFR est non-AA: >60 mL/min/{1.73_m2} (ref >60)
Globulin: 4.7 g/dL — ABNORMAL HIGH (ref 2.3–3.5)
Glucose: 137 mg/dL — ABNORMAL HIGH (ref 65–100)
Potassium: 3.9 mmol/L (ref 3.5–5.1)
Protein, total: 8 g/dL (ref 6.3–8.2)
Sodium: 137 mmol/L (ref 136–145)

## 2018-03-25 LAB — URINE MICROSCOPIC
BACTERIA, URINE: 0 /hpf
Bacteria: 0 /hpf

## 2018-03-25 LAB — HCG URINE, QL. - POC
HCG, Pregnancy, Urine, POC: NEGATIVE
Pregnancy test,urine (POC): NEGATIVE

## 2018-03-25 LAB — CBC WITH AUTO DIFFERENTIAL
Basophils %: 0 % (ref 0.0–2.0)
Basophils Absolute: 0 10*3/uL (ref 0.0–0.2)
Eosinophils %: 3 % (ref 0.5–7.8)
Eosinophils Absolute: 0.1 10*3/uL (ref 0.0–0.8)
Granulocyte Absolute Count: 0 10*3/uL (ref 0.0–0.5)
Hematocrit: 40.7 % (ref 35.8–46.3)
Hemoglobin: 13.1 g/dL (ref 11.7–15.4)
Immature Granulocytes: 0 % (ref 0.0–5.0)
Lymphocytes %: 30 % (ref 13–44)
Lymphocytes Absolute: 1.6 10*3/uL (ref 0.5–4.6)
MCH: 27.7 PG (ref 26.1–32.9)
MCHC: 32.2 g/dL (ref 31.4–35.0)
MCV: 86 FL (ref 79.6–97.8)
MPV: 10.1 FL (ref 9.4–12.3)
Monocytes %: 7 % (ref 4.0–12.0)
Monocytes Absolute: 0.4 10*3/uL (ref 0.1–1.3)
NRBC Absolute: 0 10*3/uL (ref 0.0–0.2)
Neutrophils %: 60 % (ref 43–78)
Neutrophils Absolute: 3.2 10*3/uL (ref 1.7–8.2)
Platelets: 254 10*3/uL (ref 150–450)
RBC: 4.73 M/uL (ref 4.05–5.2)
RDW: 12.8 % (ref 11.9–14.6)
WBC: 5.3 10*3/uL (ref 4.3–11.1)

## 2018-03-25 LAB — COMPREHENSIVE METABOLIC PANEL
ALT: 22 U/L (ref 12–65)
AST: 8 U/L — ABNORMAL LOW (ref 15–37)
Albumin/Globulin Ratio: 0.7 — ABNORMAL LOW (ref 1.2–3.5)
Albumin: 3.3 g/dL — ABNORMAL LOW (ref 3.5–5.0)
Alkaline Phosphatase: 84 U/L (ref 50–130)
Anion Gap: 6 mmol/L — ABNORMAL LOW (ref 7–16)
BUN: 10 MG/DL (ref 6–23)
CO2: 27 mmol/L (ref 21–32)
Calcium: 8.6 MG/DL (ref 8.3–10.4)
Chloride: 104 mmol/L (ref 98–107)
Creatinine: 0.86 MG/DL (ref 0.6–1.0)
EGFR IF NonAfrican American: >60 mL/min/{1.73_m2} (ref >60)
GFR African American: >60 mL/min/{1.73_m2} (ref >60)
Globulin: 4.7 g/dL — ABNORMAL HIGH (ref 2.3–3.5)
Glucose: 137 mg/dL — ABNORMAL HIGH (ref 65–100)
Potassium: 3.9 mmol/L (ref 3.5–5.1)
Sodium: 137 mmol/L (ref 136–145)
Total Bilirubin: 0.3 MG/DL (ref 0.2–1.1)
Total Protein: 8 g/dL (ref 6.3–8.2)

## 2018-03-25 NOTE — ED Notes (Signed)
Pt c/o facial swelling that was noticed when she woke up this morning. Pt denies any new medications. Pt denies any difficulty swallowing. Pt also c/o vomiting that started last night and diarrhea this morning. Pt denies abdominal pain. Pt states she did drink alcohol last night, unsure of how much she consumed.

## 2018-03-25 NOTE — ED Notes (Signed)

## 2018-03-25 NOTE — ED Provider Notes (Signed)
HPI:  62 F, here with right facial swelling without pain.  Denies any ear pain, dental pain, eyes discharge or fever.  No runny nose or congestion.  Noticed that this morning when she woke up.  Atraumatic.  Stated she had some alcohol at a celebration last night.  Unsure how much she drank.  Felt nauseous this morning.  Not concerned about UTI or pregnancy.  Otherwise at this time has no other complaint.    ROS  Constitutional: No fever, no chills  Skin: no rash  Eye: No vision changes  ENMT: No sore throat  Respiratory: No shortness of breath, no cough  Cardiovascular: No chest pain, no palpitations  Gastrointestinal: No vomiting, no nausea, no diarrhea, no abdominal pain  GU: No dysuria  MSK: No back pain, no muscle pain, no joint pain  Neuro: No headache, no change in mental status, no numbness, no tingling, no weakness  Psych:   Endocrine:   All other review of systems positive per history of present illness and the above otherwise negative or noncontributory.    Visit Vitals  BP 111/70 (BP 1 Location: Left arm, BP Patient Position: At rest)   Pulse 81   Temp 98.2 ??F (36.8 ??C)   Resp 16   Ht 5' 8" (1.727 m)   Wt 113.4 kg (250 lb)   SpO2 100%   BMI 38.01 kg/m??     Past Medical History:   Diagnosis Date   ??? Asthma      Past Surgical History:   Procedure Laterality Date   ??? HX GYN      c/s     None         Adult Exam   General: alert, no acute distress  Head: normocephalic, atraumatic  ENT: moist mucous membranes  TMs normal.  No tenderness to percussion.  Very minimal swelling noted under the right inferior orbits without any crepitus, erythema  EOMI, PERRLA  TMs unremarkable  Neck: supple, non-tender; full range of motion  Cardiovascular: regular rate and rhythm, normal peripheral perfusion, no edema  Respiratory:  normal respirations; no wheezing, rales or rhonchi  Gastrointestinal: soft, non-tender; no rebound or guarding, no peritoneal signs, no distension  Back: non-tender, full range of  motion  Musculoskeletal: normal range of motion, normal strength, no gross deformities  Neurological: alert and oriented x 4, no gross focal deficits; normal speech  Psychiatric: cooperative; appropriate mood and affect    MDM:  Exam is completely benign and unremarkable.  No signs of renal dysfunction.  Vital signs stable.  Do not suspect nephrotic syndrome.  No signs of sinusitis.  Nonspecific swelling.  Recommend Motrin as needed.  Stable for discharge       No results found.  Recent Results (from the past 24 hour(s))   CBC WITH AUTOMATED DIFF    Collection Time: 03/25/18 12:39 PM   Result Value Ref Range    WBC 5.3 4.3 - 11.1 K/uL    RBC 4.73 4.05 - 5.2 M/uL    HGB 13.1 11.7 - 15.4 g/dL    HCT 40.7 35.8 - 46.3 %    MCV 86.0 79.6 - 97.8 FL    MCH 27.7 26.1 - 32.9 PG    MCHC 32.2 31.4 - 35.0 g/dL    RDW 12.8 11.9 - 14.6 %    PLATELET 254 150 - 450 K/uL    MPV 10.1 9.4 - 12.3 FL    ABSOLUTE NRBC 0.00 0.0 - 0.2 K/uL  DF AUTOMATED      NEUTROPHILS 60 43 - 78 %    LYMPHOCYTES 30 13 - 44 %    MONOCYTES 7 4.0 - 12.0 %    EOSINOPHILS 3 0.5 - 7.8 %    BASOPHILS 0 0.0 - 2.0 %    IMMATURE GRANULOCYTES 0 0.0 - 5.0 %    ABS. NEUTROPHILS 3.2 1.7 - 8.2 K/UL    ABS. LYMPHOCYTES 1.6 0.5 - 4.6 K/UL    ABS. MONOCYTES 0.4 0.1 - 1.3 K/UL    ABS. EOSINOPHILS 0.1 0.0 - 0.8 K/UL    ABS. BASOPHILS 0.0 0.0 - 0.2 K/UL    ABS. IMM. GRANS. 0.0 0.0 - 0.5 K/UL   METABOLIC PANEL, COMPREHENSIVE    Collection Time: 03/25/18 12:39 PM   Result Value Ref Range    Sodium 137 136 - 145 mmol/L    Potassium 3.9 3.5 - 5.1 mmol/L    Chloride 104 98 - 107 mmol/L    CO2 27 21 - 32 mmol/L    Anion gap 6 (L) 7 - 16 mmol/L    Glucose 137 (H) 65 - 100 mg/dL    BUN 10 6 - 23 MG/DL    Creatinine 0.86 0.6 - 1.0 MG/DL    GFR est AA >60 >60 ml/min/1.80m    GFR est non-AA >60 >60 ml/min/1.764m   Calcium 8.6 8.3 - 10.4 MG/DL    Bilirubin, total 0.3 0.2 - 1.1 MG/DL    ALT (SGPT) 22 12 - 65 U/L    AST (SGOT) 8 (L) 15 - 37 U/L    Alk. phosphatase 84 50 - 130 U/L     Protein, total 8.0 6.3 - 8.2 g/dL    Albumin 3.3 (L) 3.5 - 5.0 g/dL    Globulin 4.7 (H) 2.3 - 3.5 g/dL    A-G Ratio 0.7 (L) 1.2 - 3.5     HCG URINE, QL. - POC    Collection Time: 03/25/18 12:42 PM   Result Value Ref Range    Pregnancy test,urine (POC) NEGATIVE  NEG     URINE MICROSCOPIC    Collection Time: 03/25/18 12:49 PM   Result Value Ref Range    WBC 10-20 0 /hpf    RBC 3-5 0 /hpf    Epithelial cells 10-20 0 /hpf    Bacteria 0 0 /hpf    Casts 5-10 0 /lpf         Dragon voice recognition software was used to create this note. Although the note has been reviewed and corrected where necessary, additional errors may have been overlooked and remain in the text.

## 2018-03-25 NOTE — ED Provider Notes (Signed)
HPI:  89 F, here with right facial swelling without pain.  Denies any ear pain, dental pain, eyes discharge or fever.  No runny nose or congestion.  Noticed that this morning when she woke up.  Atraumatic.  Stated she had some alcohol at a celebration last night.  Unsure how much she drank.  Felt nauseous this morning.  Not concerned about UTI or pregnancy.  Otherwise at this time has no other complaint.    ROS  Constitutional: No fever, no chills  Skin: no rash  Eye: No vision changes  ENMT: No sore throat  Respiratory: No shortness of breath, no cough  Cardiovascular: No chest pain, no palpitations  Gastrointestinal: No vomiting, no nausea, no diarrhea, no abdominal pain  GU: No dysuria  MSK: No back pain, no muscle pain, no joint pain  Neuro: No headache, no change in mental status, no numbness, no tingling, no weakness  Psych:   Endocrine:   All other review of systems positive per history of present illness and the above otherwise negative or noncontributory.    Visit Vitals  BP 111/70 (BP 1 Location: Left arm, BP Patient Position: At rest)   Pulse 81   Temp 98.2 ??F (36.8 ??C)   Resp 16   Ht '5\' 8"'$  (1.727 m)   Wt 113.4 kg (250 lb)   SpO2 100%   BMI 38.01 kg/m??     Past Medical History:   Diagnosis Date   ??? Asthma      Past Surgical History:   Procedure Laterality Date   ??? HX GYN      c/s     None         Adult Exam   General: alert, no acute distress  Head: normocephalic, atraumatic  ENT: moist mucous membranes  TMs normal.  No tenderness to percussion.  Very minimal swelling noted under the right inferior orbits without any crepitus, erythema  EOMI, PERRLA  TMs unremarkable  Neck: supple, non-tender; full range of motion  Cardiovascular: regular rate and rhythm, normal peripheral perfusion, no edema  Respiratory:  normal respirations; no wheezing, rales or rhonchi  Gastrointestinal: soft, non-tender; no rebound or guarding, no peritoneal signs, no distension  Back: non-tender, full range of motion   Musculoskeletal: normal range of motion, normal strength, no gross deformities  Neurological: alert and oriented x 4, no gross focal deficits; normal speech  Psychiatric: cooperative; appropriate mood and affect    MDM:  Exam is completely benign and unremarkable.  No signs of renal dysfunction.  Vital signs stable.  Do not suspect nephrotic syndrome.  No signs of sinusitis.  Nonspecific swelling.  Recommend Motrin as needed.  Stable for discharge       No results found.  Recent Results (from the past 24 hour(s))   CBC WITH AUTOMATED DIFF    Collection Time: 03/25/18 12:39 PM   Result Value Ref Range    WBC 5.3 4.3 - 11.1 K/uL    RBC 4.73 4.05 - 5.2 M/uL    HGB 13.1 11.7 - 15.4 g/dL    HCT 40.7 35.8 - 46.3 %    MCV 86.0 79.6 - 97.8 FL    MCH 27.7 26.1 - 32.9 PG    MCHC 32.2 31.4 - 35.0 g/dL    RDW 12.8 11.9 - 14.6 %    PLATELET 254 150 - 450 K/uL    MPV 10.1 9.4 - 12.3 FL    ABSOLUTE NRBC 0.00 0.0 - 0.2 K/uL  DF AUTOMATED      NEUTROPHILS 60 43 - 78 %    LYMPHOCYTES 30 13 - 44 %    MONOCYTES 7 4.0 - 12.0 %    EOSINOPHILS 3 0.5 - 7.8 %    BASOPHILS 0 0.0 - 2.0 %    IMMATURE GRANULOCYTES 0 0.0 - 5.0 %    ABS. NEUTROPHILS 3.2 1.7 - 8.2 K/UL    ABS. LYMPHOCYTES 1.6 0.5 - 4.6 K/UL    ABS. MONOCYTES 0.4 0.1 - 1.3 K/UL    ABS. EOSINOPHILS 0.1 0.0 - 0.8 K/UL    ABS. BASOPHILS 0.0 0.0 - 0.2 K/UL    ABS. IMM. GRANS. 0.0 0.0 - 0.5 K/UL   METABOLIC PANEL, COMPREHENSIVE    Collection Time: 03/25/18 12:39 PM   Result Value Ref Range    Sodium 137 136 - 145 mmol/L    Potassium 3.9 3.5 - 5.1 mmol/L    Chloride 104 98 - 107 mmol/L    CO2 27 21 - 32 mmol/L    Anion gap 6 (L) 7 - 16 mmol/L    Glucose 137 (H) 65 - 100 mg/dL    BUN 10 6 - 23 MG/DL    Creatinine 0.86 0.6 - 1.0 MG/DL    GFR est AA >60 >60 ml/min/1.80m    GFR est non-AA >60 >60 ml/min/1.764m   Calcium 8.6 8.3 - 10.4 MG/DL    Bilirubin, total 0.3 0.2 - 1.1 MG/DL    ALT (SGPT) 22 12 - 65 U/L    AST (SGOT) 8 (L) 15 - 37 U/L    Alk. phosphatase 84 50 - 130 U/L     Protein, total 8.0 6.3 - 8.2 g/dL    Albumin 3.3 (L) 3.5 - 5.0 g/dL    Globulin 4.7 (H) 2.3 - 3.5 g/dL    A-G Ratio 0.7 (L) 1.2 - 3.5     HCG URINE, QL. - POC    Collection Time: 03/25/18 12:42 PM   Result Value Ref Range    Pregnancy test,urine (POC) NEGATIVE  NEG     URINE MICROSCOPIC    Collection Time: 03/25/18 12:49 PM   Result Value Ref Range    WBC 10-20 0 /hpf    RBC 3-5 0 /hpf    Epithelial cells 10-20 0 /hpf    Bacteria 0 0 /hpf    Casts 5-10 0 /lpf         Dragon voice recognition software was used to create this note. Although the note has been reviewed and corrected where necessary, additional errors may have been overlooked and remain in the text.

## 2018-03-25 NOTE — ED Triage Notes (Addendum)
Pt c/o facial swelling that was noticed when she woke up this morning. Pt denies any new medications. Pt denies any difficulty swallowing. Pt also c/o vomiting that started last night and diarrhea this morning. Pt denies abdominal pain. Pt states she did drink alcohol last night, unsure of how much she consumed.

## 2018-03-25 NOTE — ED Notes (Signed)

## 2018-05-24 IMAGING — CR DG CHEST 2V
2 series · 2 of 2 positions shown · non-contrast
Comparison: None.

CLINICAL DATA: Shortness breath and chest pain since this morning.

EXAM:
CHEST  2 VIEW

[w chest lat]
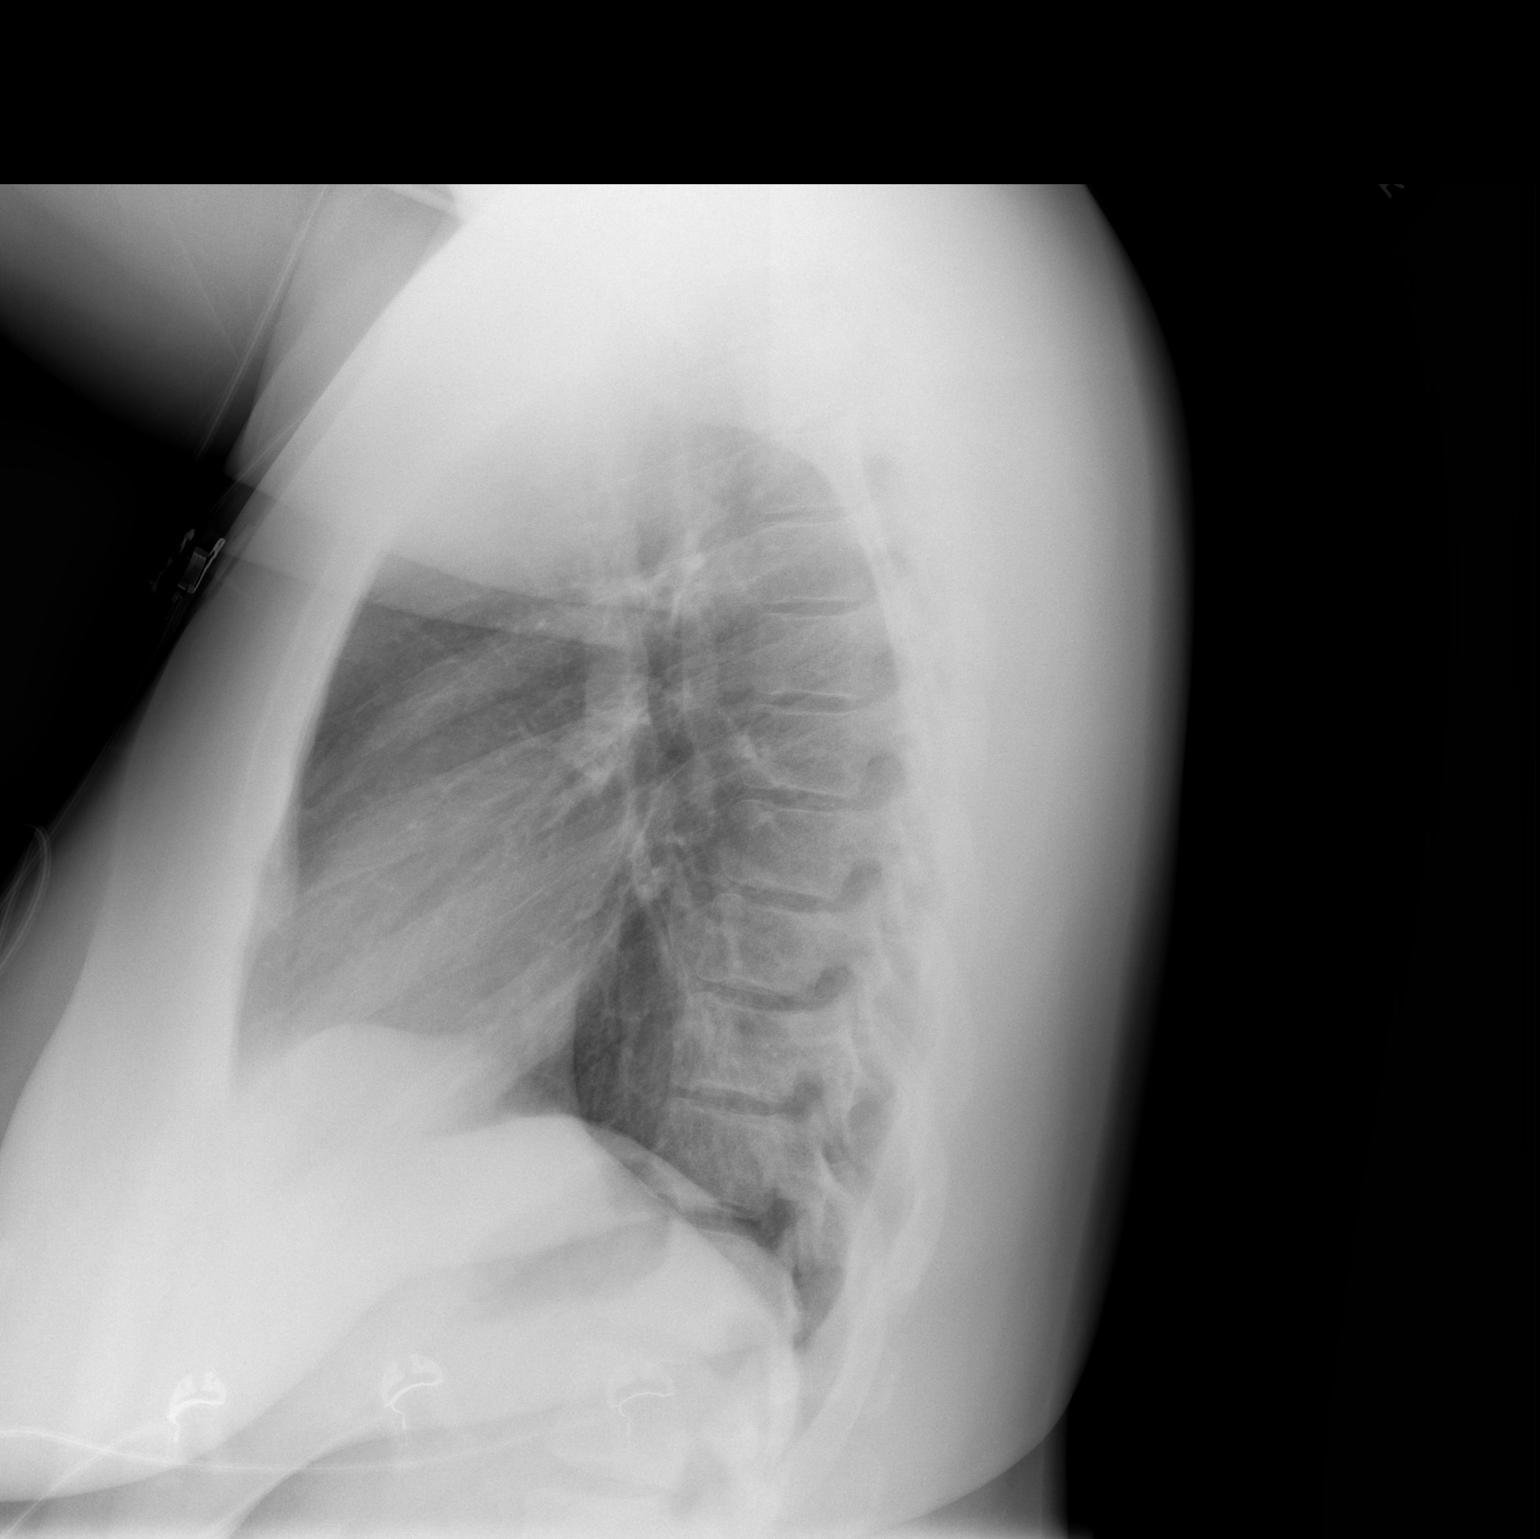

[w chest pa]
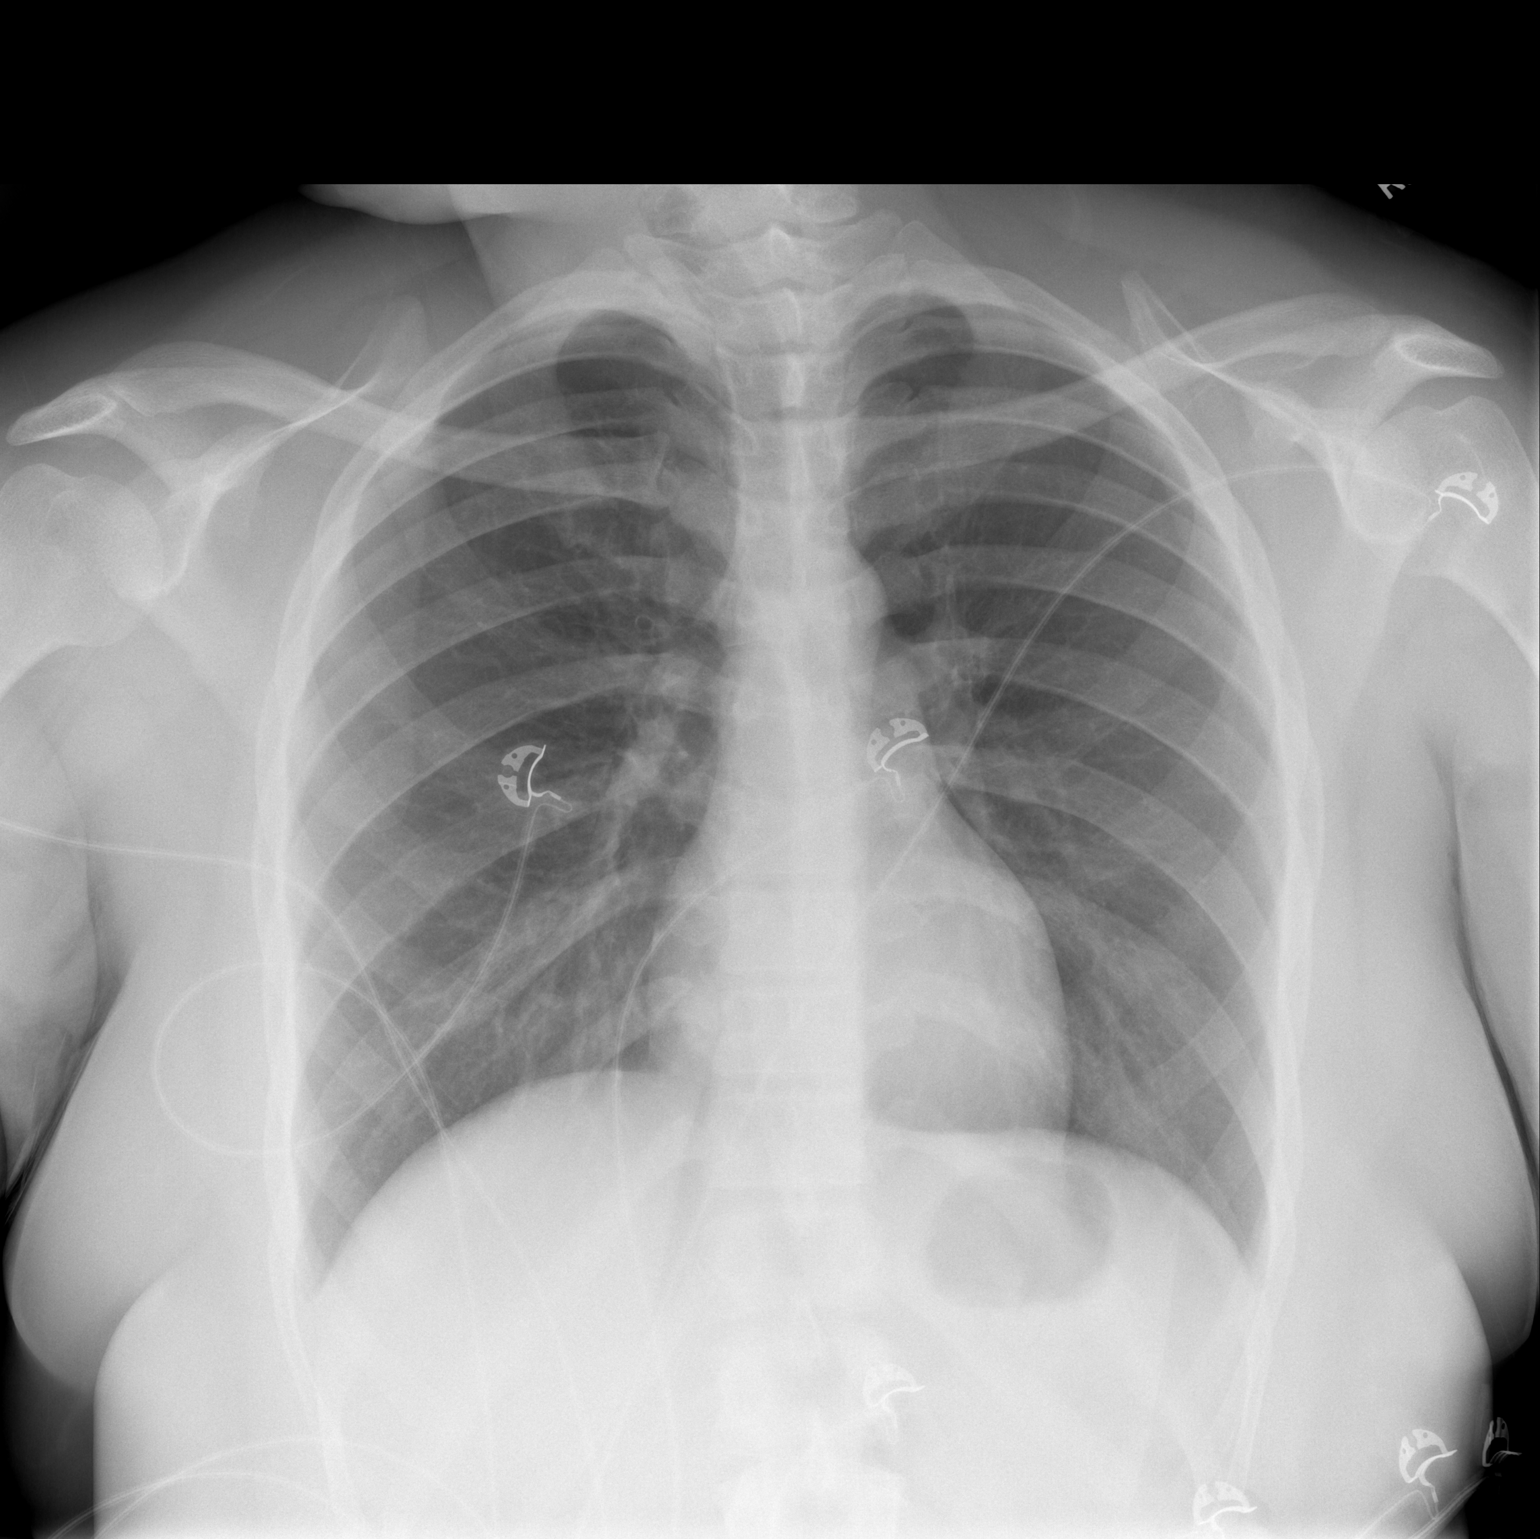

[2 of 2 positions shown; findings below may reference images not displayed]

FINDINGS: The heart size and mediastinal contours are within normal limits.
Both lungs are clear. The visualized skeletal structures are
unremarkable.
IMPRESSION: No active cardiopulmonary disease.

## 2019-03-24 ENCOUNTER — Inpatient Hospital Stay
Admit: 2019-03-24 | Discharge: 2019-03-24 | Disposition: A | Discharge status: Home / Self Care | Attending: Emergency Medicine

## 2019-03-24 DIAGNOSIS — A599 Trichomoniasis, unspecified: Secondary | ICD-10-CM

## 2019-03-24 MED ORDER — DOXYCYCLINE HYCLATE 100 MG TAB
100 mg | ORAL_TABLET | Freq: Two times a day (BID) | ORAL | 0 refills | Status: AC
Start: 2019-03-24 — End: 2019-03-31

## 2019-03-24 MED ORDER — METRONIDAZOLE 500 MG TAB
500 mg | ORAL | Status: AC
Start: 2019-03-24 — End: 2019-03-24
  Administered 2019-03-24: 21:00:00 via ORAL

## 2019-03-24 MED FILL — METRONIDAZOLE 500 MG TAB: 500 mg | ORAL | Qty: 4

## 2019-03-24 NOTE — ED Notes (Signed)
I have reviewed discharge instructions with the patient.  The patient verbalized understanding.    Patient left ED via Discharge Method: ambulatory to Home with self    Opportunity for questions and clarification provided.       Patient given 1 scripts.         To continue your aftercare when you leave the hospital, you may receive an automated call from our care team to check in on how you are doing.  This is a free service and part of our promise to provide the best care and service to meet your aftercare needs.??? If you have questions, or wish to unsubscribe from this service please call [phone].  Thank you for Choosing our [facility].

## 2019-03-24 NOTE — ED Notes (Signed)
Patient co vaginal discharge since yesterday patient denies any dysuria or hematuria.      Patient has positive test results from [facility] showing positive Trich and positive Chlamydia

## 2019-03-24 NOTE — ED Notes (Signed)
 I have reviewed discharge instructions with the patient.  The patient verbalized understanding.    Patient left ED via Discharge Method: ambulatory to Home with self.    Opportunity for questions and clarification provided.       Patient given 1 scripts.         To continue your aftercare when you leave the hospital, you may receive an automated call from our care team to check in on how you are doing.  This is a free service and part of our promise to provide the best care and service to meet your aftercare needs." If you have questions, or wish to unsubscribe from this service please call [phone].  Thank you for Choosing our [facility].

## 2019-03-24 NOTE — ED Provider Notes (Signed)
ED Provider Notes by Ilean Skill, NP at 03/24/19 1603                Author: Ilean Skill, NP  Service: Emergency Medicine  Author Type: Nurse Practitioner       Filed: 03/24/19 1615  Date of Service: 03/24/19 1603  Status: Attested           Editor: Ilean Skill, NP (Nurse Practitioner)  Cosigner: Emeline Darling, MD at 03/25/19 [phone]          Attestation signed by Emeline Darling, MD at 03/25/19 [phone]          I was present in the department when the patient was seen and was immediately available for consultation.   Earna Coder. Thomasena Edis, MD                                 29 year old female who presents today for treatment of chlamydia and trichomoniasis infections. She reports she  has had white/yellow vaginal discharge since yesterday. However, she presents today with [facility] report stating positive results for chlamydia and trichomoniasis. She states she was never treated for this and would like to be treated today. She denies any  vaginal lesions or itching, pelvic pain, abdominal pain, nausea, vomiting, or diarrhea. Report LMP approximately 2 weeks ago.      The history is provided by the patient.    Vaginal Discharge    This is a new problem. The  current episode started yesterday. The problem occurs  constantly. The problem has not changed since onset.The discharge was yellow and white. She is not pregnant. She  has not missed her period. Pertinent negatives  include no fever, no abdominal pain, no diarrhea, no nausea, no vomiting, no dysuria, no frequency, no genital burning, no genital itching, no genital lesions, no perineal pain, no perineal odor and no painful intercourse. Risk factors include history of STDs. She has tried nothing for the symptoms. Her past medical history is significant for STD.             Past Medical History:        Diagnosis  Date         ?  Asthma               Past Surgical History:         Procedure  Laterality  Date          ?  HX GYN              c/s              History reviewed. No pertinent family history.        Social History          Socioeconomic History         ?  Marital status:  SINGLE              Spouse name:  Not on file         ?  Number of children:  Not on file     ?  Years of education:  Not on file     ?  Highest education level:  Not on file       Occupational History        ?  Not on file       Social Needs         ?  Financial resource strain:  Not on file        ?  Food insecurity              Worry:  Not on file         Inability:  Not on file        ?  Transportation needs              Medical:  Not on file         Non-medical:  Not on file       Tobacco Use         ?  Smoking status:  Current Every Day Smoker              Packs/day:  0.50         ?  Smokeless tobacco:  Never Used       Substance and Sexual Activity         ?  Alcohol use:  Yes     ?  Drug use:  No     ?  Sexual activity:  Yes              Birth control/protection:  None       Lifestyle        ?  Physical activity              Days per week:  Not on file         Minutes per session:  Not on file         ?  Stress:  Not on file       Relationships        ?  Social Health visitor on phone:  Not on file         Gets together:  Not on file         Attends religious service:  Not on file         Active member of club or organization:  Not on file         Attends meetings of clubs or organizations:  Not on file         Relationship status:  Not on file        ?  Intimate partner violence              Fear of current or ex partner:  Not on file         Emotionally abused:  Not on file         Physically abused:  Not on file         Forced sexual activity:  Not on file        Other Topics  Concern        ?  Not on file       Social History Narrative        ?  Not on file              ALLERGIES: Patient has no known allergies.      Review of Systems    Constitutional: Negative for chills and fever.    Respiratory: Negative for cough and shortness of breath.      Cardiovascular: Negative for chest pain.    Gastrointestinal: Negative for abdominal pain, diarrhea, nausea and vomiting.    Genitourinary: Positive for vaginal discharge. Negative for dysuria and frequency.  There were no vitals filed for this visit.           Physical Exam   Vitals signs and nursing note reviewed.   Constitutional:        Appearance: Normal appearance.   HENT :       Head: Normocephalic and atraumatic.      Right Ear: External ear normal.      Left Ear: External ear normal.      Mouth/Throat:      Mouth: Mucous membranes are moist.      Pharynx: Oropharynx is clear.   Eyes:       General: No scleral icterus.     Extraocular Movements: Extraocular movements intact.    Neck:       Musculoskeletal: Normal range of motion and neck supple.   Cardiovascular :       Rate and Rhythm: Normal rate.   Pulmonary :       Effort: Pulmonary effort is normal. No respiratory distress.   Abdominal:      General: Abdomen is flat.      Palpations: Abdomen is soft.       Tenderness: There is no abdominal tenderness.     Musculoskeletal: Normal range of motion.      Right lower leg: No edema.      Left lower leg: No edema.    Skin:      General: Skin is warm and dry.   Neurological :       General: No focal deficit present.      Mental Status: She is alert and oriented to person, place, and time.    Psychiatric:         Mood and Affect: Mood normal.         Behavior: Behavior normal.              MDM   Number of Diagnoses or Management Options   Chlamydia: new  and does not require workup   Trichimoniasis: new and does not require workup   Diagnosis management comments: I have reviewed results provided by patient that were sent to her by [facility]. I have treated her in the ER today for trichomoniasis with flagyl and provided her with  a prescription for doxycycline. Patient states she would only like to be treated for those infections today and does not wish to have pelvic exam, urine screen, pregnancy test, or  other STD testing today. I have discussed with her possible effects of  STDs and prevention of STDs. She verbalizes understanding. She agrees with treatment plan at this time and agrees to discharge with prescription.           Amount and/or Complexity of Data Reviewed   Tests in the medicine section of CPT??: ordered and reviewed      Risk of Complications, Morbidity, and/or Mortality   Presenting problems: low  Diagnostic procedures: low  Management options: low     Patient Progress   Patient progress: improved             Procedures

## 2019-03-24 NOTE — ED Provider Notes (Signed)
29 year old female who presents today for treatment of chlamydia and trichomoniasis infections. She reports she has had white/yellow vaginal discharge since yesterday. However, she presents today with [facility] report stating positive results for chlamydia and trichomoniasis. She states she was never treated for this and would like to be treated today. She denies any vaginal lesions or itching, pelvic pain, abdominal pain, nausea, vomiting, or diarrhea. Report LMP approximately 2 weeks ago.    The history is provided by the patient.   Vaginal Discharge   This is a new problem. The current episode started yesterday. The problem occurs constantly. The problem has not changed since onset.The discharge was yellow and white. She is not pregnant. She has not missed her period. Pertinent negatives include no fever, no abdominal pain, no diarrhea, no nausea, no vomiting, no dysuria, no frequency, no genital burning, no genital itching, no genital lesions, no perineal pain, no perineal odor and no painful intercourse. Risk factors include history of STDs. She has tried nothing for the symptoms. Her past medical history is significant for STD.        Past Medical History:   Diagnosis Date   ??? Asthma        Past Surgical History:   Procedure Laterality Date   ??? HX GYN      c/s         History reviewed. No pertinent family history.    Social History     Socioeconomic History   ??? Marital status: SINGLE     Spouse name: Not on file   ??? Number of children: Not on file   ??? Years of education: Not on file   ??? Highest education level: Not on file   Occupational History   ??? Not on file   Social Needs   ??? Financial resource strain: Not on file   ??? Food insecurity     Worry: Not on file     Inability: Not on file   ??? Transportation needs     Medical: Not on file     Non-medical: Not on file   Tobacco Use   ??? Smoking status: Current Every Day Smoker     Packs/day: 0.50   ??? Smokeless tobacco: Never Used   Substance and Sexual Activity   ???  Alcohol use: Yes   ??? Drug use: No   ??? Sexual activity: Yes     Birth control/protection: None   Lifestyle   ??? Physical activity     Days per week: Not on file     Minutes per session: Not on file   ??? Stress: Not on file   Relationships   ??? Social Product manager on phone: Not on file     Gets together: Not on file     Attends religious service: Not on file     Active member of club or organization: Not on file     Attends meetings of clubs or organizations: Not on file     Relationship status: Not on file   ??? Intimate partner violence     Fear of current or ex partner: Not on file     Emotionally abused: Not on file     Physically abused: Not on file     Forced sexual activity: Not on file   Other Topics Concern   ??? Not on file   Social History Narrative   ??? Not on file  ALLERGIES: Patient has no known allergies.    Review of Systems   Constitutional: Negative for chills and fever.   Respiratory: Negative for cough and shortness of breath.    Cardiovascular: Negative for chest pain.   Gastrointestinal: Negative for abdominal pain, diarrhea, nausea and vomiting.   Genitourinary: Positive for vaginal discharge. Negative for dysuria and frequency.       There were no vitals filed for this visit.         Physical Exam  Vitals signs and nursing note reviewed.   Constitutional:       Appearance: Normal appearance.   HENT:      Head: Normocephalic and atraumatic.      Right Ear: External ear normal.      Left Ear: External ear normal.      Mouth/Throat:      Mouth: Mucous membranes are moist.      Pharynx: Oropharynx is clear.   Eyes:      General: No scleral icterus.     Extraocular Movements: Extraocular movements intact.   Neck:      Musculoskeletal: Normal range of motion and neck supple.   Cardiovascular:      Rate and Rhythm: Normal rate.   Pulmonary:      Effort: Pulmonary effort is normal. No respiratory distress.   Abdominal:      General: Abdomen is flat.      Palpations: Abdomen is soft.       Tenderness: There is no abdominal tenderness.   Musculoskeletal: Normal range of motion.      Right lower leg: No edema.      Left lower leg: No edema.   Skin:     General: Skin is warm and dry.   Neurological:      General: No focal deficit present.      Mental Status: She is alert and oriented to person, place, and time.   Psychiatric:         Mood and Affect: Mood normal.         Behavior: Behavior normal.          MDM  Number of Diagnoses or Management Options  Chlamydia: new and does not require workup  Trichimoniasis: new and does not require workup  Diagnosis management comments: I have reviewed results provided by patient that were sent to her by [facility]. I have treated her in the ER today for trichomoniasis with flagyl and provided her with a prescription for doxycycline. Patient states she would only like to be treated for those infections today and does not wish to have pelvic exam, urine screen, pregnancy test, or other STD testing today. I have discussed with her possible effects of STDs and prevention of STDs. She verbalizes understanding. She agrees with treatment plan at this time and agrees to discharge with prescription.        Amount and/or Complexity of Data Reviewed  Tests in the medicine section of CPT??: ordered and reviewed    Risk of Complications, Morbidity, and/or Mortality  Presenting problems: low  Diagnostic procedures: low  Management options: low    Patient Progress  Patient progress: improved         Procedures

## 2019-03-24 NOTE — ED Triage Notes (Addendum)
Patient co vaginal discharge since yesterday patient denies any dysuria or hematuria.      Patient has positive test results from [facility] showing positive Trich and positive Chlamydia

## 2019-08-01 ENCOUNTER — Inpatient Hospital Stay
Admit: 2019-08-01 | Discharge: 2019-08-02 | Disposition: A | Discharge status: Home / Self Care | Attending: Emergency Medicine

## 2019-08-01 DIAGNOSIS — T7840XA Allergy, unspecified, initial encounter: Secondary | ICD-10-CM

## 2019-08-01 NOTE — ED Notes (Signed)
I have reviewed discharge instructions with the patient.  The patient verbalized understanding.    Patient left ED via Discharge Method: ambulatory to Home with , self).    Opportunity for questions and clarification provided.       Patient given 2 scripts.         To continue your aftercare when you leave the hospital, you may receive an automated call from our care team to check in on how you are doing.  This is a free service and part of our promise to provide the best care and service to meet your aftercare needs." If you have questions, or wish to unsubscribe from this service please call [phone].  Thank you for Choosing our [facility] Emergency Department.

## 2019-08-01 NOTE — ED Notes (Signed)
Pt states she is having itching to her entire body for the last 2 days taking otc medication but it is not helping

## 2019-08-01 NOTE — ED Provider Notes (Signed)
Chief complaint : Rash    HISTORY OF PRESENT ILLNESS :  Location : Diffuse torso arms face and neck    Quality : Pruritic    Quantity : Constant    Timing : 2 days    Severity : Moderate    Context : No known contacts or exposures, no new foods, no new cosmetic products    Alleviating / exacerbating factors : Temporary relief with Benadryl    Associated Symptoms : No breathing or swallowing difficulties             Past Medical History:   Diagnosis Date   ??? Asthma        Past Surgical History:   Procedure Laterality Date   ??? HX GYN      c/s         No family history on file.    Social History     Socioeconomic History   ??? Marital status: SINGLE     Spouse name: Not on file   ??? Number of children: Not on file   ??? Years of education: Not on file   ??? Highest education level: Not on file   Occupational History   ??? Not on file   Tobacco Use   ??? Smoking status: Current Every Day Smoker     Packs/day: 0.50   ??? Smokeless tobacco: Never Used   Substance and Sexual Activity   ??? Alcohol use: Yes   ??? Drug use: No   ??? Sexual activity: Yes     Birth control/protection: None   Other Topics Concern   ??? Not on file   Social History Narrative   ??? Not on file     Social Determinants of Health     Financial Resource Strain:    ??? Difficulty of Paying Living Expenses:    Food Insecurity:    ??? Worried About Programme researcher, broadcasting/film/video in the Last Year:    ??? Barista in the Last Year:    Transportation Needs:    ??? Freight forwarder (Medical):    ??? Lack of Transportation (Non-Medical):    Physical Activity:    ??? Days of Exercise per Week:    ??? Minutes of Exercise per Session:    Stress:    ??? Feeling of Stress :    Social Connections:    ??? Frequency of Communication with Friends and Family:    ??? Frequency of Social Gatherings with Friends and Family:    ??? Attends Religious Services:    ??? Database administrator or Organizations:    ??? Attends Engineer, structural:    ??? Marital Status:    Intimate Programme researcher, broadcasting/film/video Violence:    ??? Fear of  Current or Ex-Partner:    ??? Emotionally Abused:    ??? Physically Abused:    ??? Sexually Abused:          ALLERGIES: Patient has no known allergies.    Review of Systems   HENT: Negative for congestion, trouble swallowing and voice change.    Eyes: Negative for discharge and redness.   Respiratory: Negative for shortness of breath, wheezing and stridor.    Musculoskeletal: Negative for arthralgias and joint swelling.   Skin: Positive for rash. Negative for color change.   All other systems reviewed and are negative.      Vitals:    08/01/19 1955   BP: (!) 106/56   Pulse: 69   Resp: 18  Temp: 98.4 ??F (36.9 ??C)   SpO2: 100%   Weight: 113.4 kg (250 lb)            Physical Exam  Vitals and nursing note reviewed.   Constitutional:       General: She is not in acute distress.     Appearance: Normal appearance. She is well-developed. She is not ill-appearing, toxic-appearing or diaphoretic.   HENT:      Head: Normocephalic and atraumatic.      Right Ear: External ear normal.      Left Ear: External ear normal.      Mouth/Throat:      Mouth: Mucous membranes are moist.      Pharynx: Oropharynx is clear. No oropharyngeal exudate or posterior oropharyngeal erythema.   Eyes:      General: No scleral icterus.        Right eye: No discharge.         Left eye: No discharge.      Extraocular Movements: Extraocular movements intact.      Conjunctiva/sclera: Conjunctivae normal.   Cardiovascular:      Rate and Rhythm: Normal rate and regular rhythm.   Pulmonary:      Effort: Pulmonary effort is normal. No respiratory distress.      Breath sounds: Normal breath sounds. No wheezing or rales.   Musculoskeletal:         General: Normal range of motion.      Cervical back: Normal range of motion and neck supple.   Skin:     General: Skin is warm and dry.      Findings: Rash present. No erythema.   Neurological:      General: No focal deficit present.      Mental Status: She is alert and oriented to person, place, and time. Mental status is  at baseline.      Motor: No abnormal muscle tone.      Comments: cni 2-12 grossly  Nl gait,  Nl speech     Psychiatric:         Mood and Affect: Mood normal.         Behavior: Behavior normal.          MDM  Number of Diagnoses or Management Options  Allergic reaction, initial encounter: new and does not require workup  Diagnosis management comments: Medical decision making note:  Nonspecific allergic reaction, treat with steroids, Pepcid, Claritin, and upgrade her Benadryl to Atarax  Indications to return discussed  This concludes the "medical decision making note" part of this emergency department visit note.         Amount and/or Complexity of Data Reviewed  Decide to obtain previous medical records or to obtain history from someone other than the patient: yes    Risk of Complications, Morbidity, and/or Mortality  Presenting problems: minimal  Diagnostic procedures: minimal  Management options: minimal    Patient Progress  Patient progress: improved         Procedures

## 2019-08-02 MED ORDER — FAMOTIDINE 20 MG TAB
20 mg | ORAL | Status: AC
Start: 2019-08-02 — End: 2019-08-01
  Administered 2019-08-02: 01:00:00 via ORAL

## 2019-08-02 MED ORDER — PREDNISONE 10 MG TAB
10 mg | ORAL | Status: AC
Start: 2019-08-02 — End: 2019-08-01
  Administered 2019-08-02: 01:00:00 via ORAL

## 2019-08-02 MED ORDER — PREDNISONE 20 MG TAB
20 mg | ORAL_TABLET | Freq: Every day | ORAL | 0 refills | Status: AC
Start: 2019-08-02 — End: 2019-08-06

## 2019-08-02 MED ORDER — HYDROXYZINE 25 MG TAB
25 mg | ORAL_TABLET | Freq: Three times a day (TID) | ORAL | 0 refills | Status: AC | PRN
Start: 2019-08-02 — End: 2019-08-06

## 2019-08-02 MED FILL — FAMOTIDINE 20 MG TAB: 20 mg | ORAL | Qty: 1

## 2019-08-02 MED FILL — PREDNISONE 10 MG TAB: 10 mg | ORAL | Qty: 1

## 2020-02-18 ENCOUNTER — Inpatient Hospital Stay
Admit: 2020-02-18 | Discharge: 2020-02-19 | Disposition: A | Discharge status: Home / Self Care | Attending: Student in an Organized Health Care Education/Training Program

## 2020-02-18 DIAGNOSIS — U071 COVID-19: Secondary | ICD-10-CM

## 2020-02-18 LAB — COVID-19 RAPID TEST: COVID-19 rapid test: DETECTED — CR

## 2020-02-18 LAB — INFLUENZA A & B AG (RAPID TEST)
Influenza A Ag: NEGATIVE
Influenza B Ag: NEGATIVE

## 2020-02-18 LAB — COVID-19, RAPID: SARS-CoV-2, Rapid: DETECTED — CR

## 2020-02-18 LAB — RAPID INFLUENZA A/B ANTIGENS
Influenza A Ag: NEGATIVE
Influenza B Ag: NEGATIVE

## 2020-02-18 MED ORDER — BENZONATATE 100 MG CAP
100 mg | ORAL_CAPSULE | Freq: Three times a day (TID) | ORAL | 0 refills | Status: AC | PRN
Start: 2020-02-18 — End: 2020-02-25

## 2020-02-18 MED ORDER — ALBUTEROL SULFATE HFA 90 MCG/ACTUATION AEROSOL INHALER
90 mcg/actuation | RESPIRATORY_TRACT | 0 refills | Status: AC | PRN
Start: 2020-02-18 — End: ?

## 2020-02-18 MED ORDER — ACETAMINOPHEN 500 MG TAB
500 mg | Freq: Once | ORAL | Status: AC
Start: 2020-02-18 — End: 2020-02-18
  Administered 2020-02-18: 23:00:00 via ORAL

## 2020-02-18 MED FILL — TYLENOL EXTRA STRENGTH 500 MG TABLET: 500 mg | ORAL | Qty: 2

## 2020-02-18 NOTE — ED Notes (Signed)

## 2020-02-18 NOTE — ED Provider Notes (Signed)
ED  Provider Notes by Carlynn Purl, PA at 02/18/20 1731                Author: Carlynn Purl, PA  Service: Emergency Medicine  Author Type: Physician Assistant       Filed: 02/18/20 1848  Date of Service: 02/18/20 1731  Status: Attested           Editor: Carlynn Purl, PA (Physician Assistant)  Cosigner: Lacie Draft, DO at 02/18/20 2301          Attestation signed by Lacie Draft, DO at 02/18/20 2301          I was available in the ED, but did not materially participate in the care of this patient.    Brett Albino Rosebrock, DO.                                 Patient is 30 year old female with history of asthma who presents with body aches fever and cough since yesterday.   To the people she been around tested positive for Covid.  She went to [facility] yesterday and had a negative Covid test.  Denies dysuria or other complaints.                  Past Medical History:        Diagnosis  Date         ?  Asthma               Past Surgical History:         Procedure  Laterality  Date          ?  HX GYN              c/s             History reviewed. No pertinent family history.        Social History          Socioeconomic History         ?  Marital status:  SINGLE              Spouse name:  Not on file         ?  Number of children:  Not on file     ?  Years of education:  Not on file     ?  Highest education level:  Not on file       Occupational History        ?  Not on file       Tobacco Use         ?  Smoking status:  Current Every Day Smoker              Packs/day:  0.50         ?  Smokeless tobacco:  Never Used       Substance and Sexual Activity         ?  Alcohol use:  Yes     ?  Drug use:  No     ?  Sexual activity:  Yes              Birth control/protection:  None        Other Topics  Concern        ?  Not on file       Social History Narrative        ?  Not on file          Social Determinants of Health          Financial Resource Strain:         ?  Difficulty of Paying Living Expenses: Not on  file       Food Insecurity:         ?  Worried About Running Out of Food in the Last Year: Not on file     ?  Ran Out of Food in the Last Year: Not on file       Transportation Needs:         ?  Lack of Transportation (Medical): Not on file     ?  Lack of Transportation (Non-Medical): Not on file       Physical Activity:         ?  Days of Exercise per Week: Not on file     ?  Minutes of Exercise per Session: Not on file       Stress:         ?  Feeling of Stress : Not on file       Social Connections:         ?  Frequency of Communication with Friends and Family: Not on file     ?  Frequency of Social Gatherings with Friends and Family: Not on file     ?  Attends Religious Services: Not on file     ?  Active Member of Clubs or Organizations: Not on file     ?  Attends Banker Meetings: Not on file     ?  Marital Status: Not on file       Intimate Partner Violence:         ?  Fear of Current or Ex-Partner: Not on file     ?  Emotionally Abused: Not on file     ?  Physically Abused: Not on file     ?  Sexually Abused: Not on file       Housing Stability:         ?  Unable to Pay for Housing in the Last Year: Not on file     ?  Number of Places Lived in the Last Year: Not on file        ?  Unstable Housing in the Last Year: Not on file              ALLERGIES: Patient has no known allergies.      Review of Systems    Constitutional: Positive for fatigue and fever .    HENT: Positive for congestion.     Respiratory: Positive for cough.     Cardiovascular: Negative.     Gastrointestinal: Negative.     Genitourinary: Negative.     All other systems reviewed and are negative.           Vitals:          02/18/20 1730        BP:  (P) 116/77     Pulse:  (P) 98     Resp:  (P) 16     Temp:  (P) 100.3 ??F (37.9 ??C)        SpO2:  (P) 98%                Physical Exam   Vitals and nursing note reviewed.   Constitutional:  General: She is not in acute distress.     Appearance: Normal appearance. She is obese.    HENT:       Head: Normocephalic.   Eyes :       Extraocular Movements: Extraocular movements intact.      Pupils: Pupils are equal, round, and reactive to light.   Cardiovascular:       Rate and Rhythm: Normal rate and regular rhythm.   Pulmonary :       Effort: Pulmonary effort is normal.      Breath sounds: Normal breath sounds.   Musculoskeletal :          General: Normal range of motion.    Skin:      General: Skin is warm and dry.   Neurological :       General: No focal deficit present.      Mental Status: She is alert.      Gait: Gait normal.    Psychiatric:         Mood and Affect: Mood normal.         Behavior: Behavior normal.         Thought Content: Thought content normal.              MDM   Number of Diagnoses or Management Options   Diagnosis management comments: Patient with symptoms occurred for 2 days.  Had negative test yesterday.  Positive here.  Recommended Tylenol for fever and hydration.  Strict return precautions given.  Vital signs stable in emergency department.          Amount and/or Complexity of Data Reviewed   Clinical lab tests: reviewed      Risk of Complications, Morbidity, and/or Mortality   Presenting problems: low  Diagnostic procedures: low  Management options: low     Patient Progress   Patient progress: stable             Procedures

## 2020-02-18 NOTE — ED Notes (Signed)
Pt reports body aches, fever cough and chills since yesterday. Pt states she tested negative at [facility].    Alene Mires, RN

## 2020-02-21 NOTE — Progress Notes (Signed)
Date/Time:  02/21/2020 11:40 AM   Call within 2 business days of discharge: Yes   Attempted to reach patient by telephone for COVID TOC. Unable to reach patient. Left HIPPA compliant message requesting a return call. Episode will be resolved at this time. Episode will resume if returned call is received.

## 2020-03-27 ENCOUNTER — Inpatient Hospital Stay
Admit: 2020-03-27 | Discharge: 2020-03-28 | Disposition: A | Discharge status: Home / Self Care | Attending: Emergency Medicine

## 2020-03-27 ENCOUNTER — Emergency Department: Admit: 2020-03-27

## 2020-03-27 DIAGNOSIS — R42 Dizziness and giddiness: Secondary | ICD-10-CM

## 2020-03-27 LAB — CBC WITH AUTOMATED DIFF
ABS. BASOPHILS: 0 10*3/uL (ref 0.0–0.2)
ABS. EOSINOPHILS: 0.2 10*3/uL (ref 0.0–0.8)
ABS. IMM. GRANS.: 0 10*3/uL (ref 0.0–0.5)
ABS. LYMPHOCYTES: 1.7 10*3/uL (ref 0.5–4.6)
ABS. MONOCYTES: 0.4 10*3/uL (ref 0.1–1.3)
ABS. NEUTROPHILS: 2.3 10*3/uL (ref 1.7–8.2)
ABSOLUTE NRBC: 0 10*3/uL (ref 0.0–0.2)
BASOPHILS: 1 % (ref 0.0–2.0)
EOSINOPHILS: 5 % (ref 0.5–7.8)
HCT: 35 % — ABNORMAL LOW (ref 35.8–46.3)
HGB: 10.7 g/dL — ABNORMAL LOW (ref 11.7–15.4)
IMMATURE GRANULOCYTES: 0 % (ref 0.0–5.0)
LYMPHOCYTES: 36 % (ref 13–44)
MCH: 26.8 PG (ref 26.1–32.9)
MCHC: 30.6 g/dL — ABNORMAL LOW (ref 31.4–35.0)
MCV: 87.7 FL (ref 79.6–97.8)
MONOCYTES: 9 % (ref 4.0–12.0)
MPV: 10.3 FL (ref 9.4–12.3)
NEUTROPHILS: 49 % (ref 43–78)
PLATELET: 226 10*3/uL (ref 150–450)
RBC: 3.99 M/uL — ABNORMAL LOW (ref 4.05–5.2)
RDW: 12.8 % (ref 11.9–14.6)
WBC: 4.7 10*3/uL (ref 4.3–11.1)

## 2020-03-27 LAB — HCG URINE, QL. - POC
HCG, Pregnancy, Urine, POC: NEGATIVE
Pregnancy test,urine (POC): NEGATIVE

## 2020-03-27 LAB — CBC WITH AUTO DIFFERENTIAL
Basophils %: 1 % (ref 0.0–2.0)
Basophils Absolute: 0 10*3/uL (ref 0.0–0.2)
Eosinophils %: 5 % (ref 0.5–7.8)
Eosinophils Absolute: 0.2 10*3/uL (ref 0.0–0.8)
Granulocyte Absolute Count: 0 10*3/uL (ref 0.0–0.5)
Hematocrit: 35 % — ABNORMAL LOW (ref 35.8–46.3)
Hemoglobin: 10.7 g/dL — ABNORMAL LOW (ref 11.7–15.4)
Immature Granulocytes: 0 % (ref 0.0–5.0)
Lymphocytes %: 36 % (ref 13–44)
Lymphocytes Absolute: 1.7 10*3/uL (ref 0.5–4.6)
MCH: 26.8 PG (ref 26.1–32.9)
MCHC: 30.6 g/dL — ABNORMAL LOW (ref 31.4–35.0)
MCV: 87.7 FL (ref 79.6–97.8)
MPV: 10.3 FL (ref 9.4–12.3)
Monocytes %: 9 % (ref 4.0–12.0)
Monocytes Absolute: 0.4 10*3/uL (ref 0.1–1.3)
NRBC Absolute: 0 10*3/uL (ref 0.0–0.2)
Neutrophils %: 49 % (ref 43–78)
Neutrophils Absolute: 2.3 10*3/uL (ref 1.7–8.2)
Platelets: 226 10*3/uL (ref 150–450)
RBC: 3.99 M/uL — ABNORMAL LOW (ref 4.05–5.2)
RDW: 12.8 % (ref 11.9–14.6)
WBC: 4.7 10*3/uL (ref 4.3–11.1)

## 2020-03-27 MED ORDER — SODIUM CHLORIDE 0.9% BOLUS IV
0.9 % | Freq: Once | INTRAVENOUS | Status: AC
Start: 2020-03-27 — End: 2020-03-27
  Administered 2020-03-27: via INTRAVENOUS

## 2020-03-27 MED ORDER — MECLIZINE 25 MG TAB
25 mg | ORAL | Status: AC
Start: 2020-03-27 — End: 2020-03-27
  Administered 2020-03-28: via ORAL

## 2020-03-27 NOTE — Progress Notes (Signed)
 Progress Notes by Tessa Daphne CROME, RN at 03/27/20 2102                Author: Tessa Daphne CROME, RN  Service: EMERGENCY  Author Type: Care Management       Filed: 03/29/20 1230  Date of Service: 03/27/20 2102  Status: Signed          Editor: Tessa Daphne CROME, RN (Care Management)               Called pt to aid in any follow up concerns. PT states that sh e is waiting for her Medicaid to start. I advised her to go ahead and contact [employer], b/c they  will see her even without Medicaid. Also ask about specialty physicans and when they are available. No further needs at this time. Pt advised to call back for any referrals/questions.

## 2020-03-27 NOTE — ED Notes (Signed)
ED  Notes by Vira Blanco, RN at 03/27/20 2102                Author: Vira Blanco, RN  Service: --  Author Type: Registered Nurse       Filed: 03/27/20 2102  Date of Service: 03/27/20 2102  Status: Signed          Editor: Vira Blanco, RN (Registered Nurse)               I have reviewed discharge instructions with the patient.  The patient verbalized understanding.      Patient left ED via Discharge Method: ambulatory to Home with family.      Opportunity for questions and clarification provided.          Patient given 1 scripts.             To continue your aftercare when you leave the hospital, you may  receive an automated call from our care team to check in on how you are doing.  This is a free service and part of our promise to provide the best care and service to meet your aftercare needs. If you have questions, or wish to unsubscribe  from this service please call [phone].  Thank you for Choosing our [facility] Emergency Department.

## 2020-03-27 NOTE — ED Notes (Signed)
ED Triage Notes by Marla Roe, RN at 03/27/20 1839                Author: Marla Roe, RN  Service: --  Author Type: Registered Nurse       Filed: 03/27/20 1840  Date of Service: 03/27/20 1839  Status: Signed          Editor: Marla Roe, RN (Registered Nurse)               Pt reports headache and near syncopal episode today at work, she states these episodes are more frequent. She states nothing makes it better, nothing makes it worse.

## 2020-03-27 NOTE — ED Provider Notes (Signed)
ED  Provider Notes by Carlynn Purlunlap, Sacheen Arrasmith, PA at 03/27/20 1944                Author: Carlynn Purlunlap, Pricella Gaugh, PA  Service: --  Author Type: Physician Assistant       Filed: 03/27/20 2049  Date of Service: 03/27/20 1944  Status: Attested           Editor: Carlynn Purlunlap, Kaitlyn Franko, PA (Physician Assistant)  Cosigner: Roxan HockeyFinn, William F, MD at 03/28/20 0002          Attestation signed by Roxan HockeyFinn, William F, MD at 03/28/20 0002          I was physically present and personally available for consultation in the emergency department during at least part of the time the patient was in the Emergency  Department.                                 Patient is a 30 year old female who presents with several months of tinnitus with dizziness.  She says she noticed  it before Christmas but it was less frequent.  Now she says she it is several times a day almost every 2 hours.  She does not have syncopal episodes, but when she develops the dizziness sometimes she feels like she will fall over if she does not hold  onto something.  She did have Covid last month, but says the symptoms started prior to that.  Has not been seen for this because she is scared of a diagnosis.  She denies chest pain shortness of breath or palpitations.  No nausea or vomiting.  Not taking  any medications daily.  She says tinnitus is described as muffled hearing like her head is in a bottle associated with pressure, and it will last for about a minute and then resolve spontaneously.  There are no exacerbating, alleviating, or exciting factors.  Unable to reproduce the symptoms.  She also reports chronic headaches for years.  Tylenol used to work, but now nothing works.                  Past Medical History:        Diagnosis  Date         ?  Asthma               Past Surgical History:         Procedure  Laterality  Date          ?  HX GYN              c/s             History reviewed. No pertinent family history.        Social History          Socioeconomic History         ?  Marital  status:  SINGLE              Spouse name:  Not on file         ?  Number of children:  Not on file     ?  Years of education:  Not on file     ?  Highest education level:  Not on file       Occupational History        ?  Not on file       Tobacco Use         ?  Smoking status:  Current Every Day Smoker              Packs/day:  0.50         ?  Smokeless tobacco:  Never Used       Substance and Sexual Activity         ?  Alcohol use:  Yes     ?  Drug use:  No     ?  Sexual activity:  Yes              Birth control/protection:  None        Other Topics  Concern        ?  Not on file       Social History Narrative        ?  Not on file          Social Determinants of Health          Financial Resource Strain:         ?  Difficulty of Paying Living Expenses: Not on file       Food Insecurity:         ?  Worried About Running Out of Food in the Last Year: Not on file     ?  Ran Out of Food in the Last Year: Not on file       Transportation Needs:         ?  Lack of Transportation (Medical): Not on file     ?  Lack of Transportation (Non-Medical): Not on file       Physical Activity:         ?  Days of Exercise per Week: Not on file     ?  Minutes of Exercise per Session: Not on file       Stress:         ?  Feeling of Stress : Not on file       Social Connections:         ?  Frequency of Communication with Friends and Family: Not on file     ?  Frequency of Social Gatherings with Friends and Family: Not on file     ?  Attends Religious Services: Not on file     ?  Active Member of Clubs or Organizations: Not on file     ?  Attends Banker Meetings: Not on file     ?  Marital Status: Not on file       Intimate Partner Violence:         ?  Fear of Current or Ex-Partner: Not on file     ?  Emotionally Abused: Not on file     ?  Physically Abused: Not on file     ?  Sexually Abused: Not on file       Housing Stability:         ?  Unable to Pay for Housing in the Last Year: Not on file     ?  Number of Places  Lived in the Last Year: Not on file        ?  Unstable Housing in the Last Year: Not on file              ALLERGIES: Patient has no known allergies.      Review of Systems    Constitutional: Negative.     HENT: Positive for tinnitus.  Respiratory: Negative.     Cardiovascular: Negative.     Gastrointestinal: Negative.     Genitourinary: Negative.     Neurological: Positive for dizziness and headaches .    All other systems reviewed and are negative.           Vitals:          03/27/20 1837        BP:  115/74     Pulse:  78     Resp:  18     Temp:  98.7 ??F (37.1 ??C)     SpO2:  99%     Weight:  121.6 kg (268 lb)        Height:  5\' 8"  (1.727 m)                Physical Exam   Vitals and nursing note reviewed.   Constitutional:        General: She is not in acute distress.     Appearance: Normal appearance. She is obese. She is not ill-appearing or toxic-appearing.   HENT:       Head: Normocephalic.      Right Ear: Tympanic membrane normal.      Left Ear: Tympanic membrane normal.      Nose: Nose normal.      Mouth/Throat:      Mouth: Mucous membranes are moist.   Eyes:       Extraocular Movements: Extraocular movements intact.      Pupils: Pupils are equal, round, and reactive to light.   Cardiovascular:       Rate and Rhythm: Normal rate and regular rhythm.      Pulses: Normal pulses.      Heart sounds: Normal heart sounds.    Pulmonary:       Effort: Pulmonary effort is normal.      Breath sounds: Normal breath sounds.   Abdominal :      General: Bowel sounds are normal.      Palpations: Abdomen is soft. There is no mass.      Tenderness: There is no abdominal tenderness. There is no guarding or rebound.     Musculoskeletal:          General: Normal range of motion.      Cervical back: Normal range of motion.    Skin:      General: Skin is warm and dry.      Findings: No rash.    Neurological:       General: No focal deficit present.      Mental Status: She is alert and oriented to person, place, and time. Mental  status is at baseline.      Cranial Nerves: No cranial nerve deficit.      Sensory: No sensory deficit.      Motor:  No weakness.      Coordination: Coordination normal.      Gait: Gait normal.    Psychiatric:         Mood and Affect: Mood normal.         Behavior: Behavior normal.         Thought Content: Thought content normal.              MDM   Number of Diagnoses or Management Options   Dizziness   Tinnitus of both ears   Diagnosis management comments: Patient is a 30 year old female with a few months of recurrent dizziness with muffled hearing associated.  This last for about a minute and resolves.  She is unable  to reproduce his symptoms and there are no exacerbating or alleviating factors.  Patient's work-up here is grossly unrevealing.  Negative CT of the head.  No significant lab abnormalities to explain her symptoms.  We will treat patient with Antivert,  but she is to follow-up with ENT as well as neurology.  Strict return precautions given for the interim.          Amount and/or Complexity of Data Reviewed   Clinical lab tests: reviewed   Tests in the radiology section of CPT??: reviewed    Discuss the patient with other providers: yes (Dr. Irving Copas)      Risk of Complications, Morbidity, and/or Mortality   Presenting problems: moderate  Diagnostic procedures: moderate  Management options: moderate     Patient Progress   Patient progress: stable             Procedures

## 2020-03-27 NOTE — ED Notes (Signed)
ED Notes by Arlyn Dunning, RN at 03/27/20 1845                Author: Arlyn Dunning, RN  Service: --  Author Type: Registered Nurse       Filed: 03/27/20 1850  Date of Service: 03/27/20 1845  Status: Signed          Editor: Arlyn Dunning, RN (Registered Nurse)               Pt reports 2 months intermittent headaches and dizziness progressively worsened to syncopal events w tinnitus, pt reports had syncopal episode today at work (-)injury/trauma.  Denies vision changes, chest pain, dyspnea, blood in stools/urine   LMP middle of February   A&OX4

## 2020-03-27 NOTE — ED Notes (Signed)
ED Notes by Arlyn Dunning, RN at 03/27/20 1900                Author: Arlyn Dunning, RN  Service: --  Author Type: Registered Nurse       Filed: 03/27/20 1901  Date of Service: 03/27/20 1900  Status: Signed          Editor: Arlyn Dunning, RN (Registered Nurse)               Lytle Michaels given to Jasmine December, RN

## 2020-03-27 NOTE — ED Notes (Signed)
ED  Notes by Vira Blanco, RN at 03/27/20 1910                Author: Vira Blanco, RN  Service: --  Author Type: Registered Nurse       Filed: 03/27/20 1910  Date of Service: 03/27/20 1910  Status: Signed          Editor: Vira Blanco, RN (Registered Nurse)               Zenovia Jordan from [employer], RN and Adelina Mings, RN. NP with pt now.

## 2020-03-28 LAB — METABOLIC PANEL, COMPREHENSIVE
A-G Ratio: 0.7 — ABNORMAL LOW (ref 1.2–3.5)
ALT (SGPT): 22 U/L (ref 12–65)
AST (SGOT): 7 U/L — ABNORMAL LOW (ref 15–37)
Albumin: 3.2 g/dL — ABNORMAL LOW (ref 3.5–5.0)
Alk. phosphatase: 70 U/L (ref 50–136)
Anion gap: 5 mmol/L — ABNORMAL LOW (ref 7–16)
BUN: 12 MG/DL (ref 6–23)
Bilirubin, total: 0.2 MG/DL (ref 0.2–1.1)
CO2: 29 mmol/L (ref 21–32)
Calcium: 8.6 MG/DL (ref 8.3–10.4)
Chloride: 105 mmol/L (ref 98–107)
Creatinine: 0.98 MG/DL (ref 0.6–1.0)
GFR est AA: >60 mL/min/{1.73_m2} (ref >60)
GFR est non-AA: >60 mL/min/{1.73_m2} (ref >60)
Globulin: 4.6 g/dL — ABNORMAL HIGH (ref 2.3–3.5)
Glucose: 112 mg/dL — ABNORMAL HIGH (ref 65–100)
Potassium: 3.6 mmol/L (ref 3.5–5.1)
Protein, total: 7.8 g/dL (ref 6.3–8.2)
Sodium: 139 mmol/L (ref 136–145)

## 2020-03-28 LAB — MAGNESIUM
Magnesium: 1.7 mg/dL — ABNORMAL LOW (ref 1.8–2.4)
Magnesium: 1.7 mg/dL — ABNORMAL LOW (ref 1.8–2.4)

## 2020-03-28 LAB — COMPREHENSIVE METABOLIC PANEL
ALT: 22 U/L (ref 12–65)
AST: 7 U/L — ABNORMAL LOW (ref 15–37)
Albumin/Globulin Ratio: 0.7 — ABNORMAL LOW (ref 1.2–3.5)
Albumin: 3.2 g/dL — ABNORMAL LOW (ref 3.5–5.0)
Alkaline Phosphatase: 70 U/L (ref 50–136)
Anion Gap: 5 mmol/L — ABNORMAL LOW (ref 7–16)
BUN: 12 MG/DL (ref 6–23)
CO2: 29 mmol/L (ref 21–32)
Calcium: 8.6 MG/DL (ref 8.3–10.4)
Chloride: 105 mmol/L (ref 98–107)
Creatinine: 0.98 MG/DL (ref 0.6–1.0)
EGFR IF NonAfrican American: >60 mL/min/{1.73_m2} (ref >60)
GFR African American: >60 mL/min/{1.73_m2} (ref >60)
Globulin: 4.6 g/dL — ABNORMAL HIGH (ref 2.3–3.5)
Glucose: 112 mg/dL — ABNORMAL HIGH (ref 65–100)
Potassium: 3.6 mmol/L (ref 3.5–5.1)
Sodium: 139 mmol/L (ref 136–145)
Total Bilirubin: 0.2 MG/DL (ref 0.2–1.1)
Total Protein: 7.8 g/dL (ref 6.3–8.2)

## 2020-03-28 MED ORDER — MECLIZINE 25 MG TAB
25 mg | ORAL_TABLET | Freq: Three times a day (TID) | ORAL | 0 refills | Status: AC | PRN
Start: 2020-03-28 — End: 2020-04-06

## 2020-03-28 MED FILL — MECLIZINE 25 MG TAB: 25 mg | ORAL | Qty: 1

## 2020-05-19 DIAGNOSIS — O26899 Other specified pregnancy related conditions, unspecified trimester: Secondary | ICD-10-CM

## 2020-05-19 NOTE — ED Notes (Signed)
Pt states she started experiencing abdominal pain that radiates to the lower back area that started a few days ago.

## 2020-05-20 ENCOUNTER — Emergency Department: Admit: 2020-05-20

## 2020-05-20 ENCOUNTER — Inpatient Hospital Stay
Admit: 2020-05-20 | Discharge: 2020-05-20 | Disposition: A | Discharge status: Home / Self Care | Attending: Student in an Organized Health Care Education/Training Program

## 2020-05-20 LAB — HCG URINE, QL. - POC
HCG, Pregnancy, Urine, POC: POSITIVE — AB
Pregnancy test,urine (POC): POSITIVE — AB

## 2020-05-20 LAB — METABOLIC PANEL, COMPREHENSIVE
A-G Ratio: 0.7 — ABNORMAL LOW (ref 1.2–3.5)
ALT (SGPT): 19 U/L (ref 12–65)
AST (SGOT): 13 U/L — ABNORMAL LOW (ref 15–37)
Albumin: 3.5 g/dL (ref 3.5–5.0)
Alk. phosphatase: 77 U/L (ref 50–136)
Anion gap: 8 mmol/L (ref 7–16)
BUN: 10 MG/DL (ref 6–23)
Bilirubin, total: 0.2 MG/DL (ref 0.2–1.1)
CO2: 24 mmol/L (ref 21–32)
Calcium: 8.4 MG/DL (ref 8.3–10.4)
Chloride: 105 mmol/L (ref 98–107)
Creatinine: 0.95 MG/DL (ref 0.6–1.0)
GFR est AA: >60 mL/min/{1.73_m2} (ref >60)
GFR est non-AA: >60 mL/min/{1.73_m2} (ref >60)
Globulin: 4.8 g/dL — ABNORMAL HIGH (ref 2.3–3.5)
Glucose: 95 mg/dL (ref 65–100)
Potassium: 3.5 mmol/L (ref 3.5–5.1)
Protein, total: 8.3 g/dL — ABNORMAL HIGH (ref 6.3–8.2)
Sodium: 137 mmol/L (ref 136–145)

## 2020-05-20 LAB — CBC WITH AUTOMATED DIFF
ABS. BASOPHILS: 0 10*3/uL (ref 0.0–0.2)
ABS. EOSINOPHILS: 0.1 10*3/uL (ref 0.0–0.8)
ABS. IMM. GRANS.: 0 10*3/uL (ref 0.0–0.5)
ABS. LYMPHOCYTES: 1.5 10*3/uL (ref 0.5–4.6)
ABS. MONOCYTES: 0.3 10*3/uL (ref 0.1–1.3)
ABS. NEUTROPHILS: 4 10*3/uL (ref 1.7–8.2)
ABSOLUTE NRBC: 0 10*3/uL (ref 0.0–0.2)
BASOPHILS: 0 % (ref 0.0–2.0)
EOSINOPHILS: 2 % (ref 0.5–7.8)
HCT: 36.4 % (ref 35.8–46.3)
HGB: 11.7 g/dL (ref 11.7–15.4)
IMMATURE GRANULOCYTES: 0 % (ref 0.0–5.0)
LYMPHOCYTES: 25 % (ref 13–44)
MCH: 26.7 PG (ref 26.1–32.9)
MCHC: 32.1 g/dL (ref 31.4–35.0)
MCV: 83.1 FL (ref 79.6–97.8)
MONOCYTES: 6 % (ref 4.0–12.0)
MPV: 11 FL (ref 9.4–12.3)
NEUTROPHILS: 67 % (ref 43–78)
PLATELET: 243 10*3/uL (ref 150–450)
RBC: 4.38 M/uL (ref 4.05–5.2)
RDW: 12.3 % (ref 11.9–14.6)
WBC: 6.1 10*3/uL (ref 4.3–11.1)

## 2020-05-20 LAB — LIPASE
Lipase: 61 U/L — ABNORMAL LOW (ref 73–393)
Lipase: 61 U/L — ABNORMAL LOW (ref 73–393)

## 2020-05-20 LAB — BETA HCG, QT
Beta HCG, QT: 225 m[IU]/mL — ABNORMAL HIGH (ref 0.0–6.0)
hCG Quant: 225 m[IU]/mL — ABNORMAL HIGH (ref 0.0–6.0)

## 2020-05-20 LAB — WET PREP
Wet Prep: NONE SEEN
Wet prep: NONE SEEN

## 2020-05-20 LAB — CBC WITH AUTO DIFFERENTIAL
Basophils %: 0 % (ref 0.0–2.0)
Basophils Absolute: 0 10*3/uL (ref 0.0–0.2)
Eosinophils %: 2 % (ref 0.5–7.8)
Eosinophils Absolute: 0.1 10*3/uL (ref 0.0–0.8)
Granulocyte Absolute Count: 0 10*3/uL (ref 0.0–0.5)
Hematocrit: 36.4 % (ref 35.8–46.3)
Hemoglobin: 11.7 g/dL (ref 11.7–15.4)
Immature Granulocytes: 0 % (ref 0.0–5.0)
Lymphocytes %: 25 % (ref 13–44)
Lymphocytes Absolute: 1.5 10*3/uL (ref 0.5–4.6)
MCH: 26.7 PG (ref 26.1–32.9)
MCHC: 32.1 g/dL (ref 31.4–35.0)
MCV: 83.1 FL (ref 79.6–97.8)
MPV: 11 FL (ref 9.4–12.3)
Monocytes %: 6 % (ref 4.0–12.0)
Monocytes Absolute: 0.3 10*3/uL (ref 0.1–1.3)
NRBC Absolute: 0 10*3/uL (ref 0.0–0.2)
Neutrophils %: 67 % (ref 43–78)
Neutrophils Absolute: 4 10*3/uL (ref 1.7–8.2)
Platelets: 243 10*3/uL (ref 150–450)
RBC: 4.38 M/uL (ref 4.05–5.2)
RDW: 12.3 % (ref 11.9–14.6)
WBC: 6.1 10*3/uL (ref 4.3–11.1)

## 2020-05-20 LAB — COMPREHENSIVE METABOLIC PANEL
ALT: 19 U/L (ref 12–65)
AST: 13 U/L — ABNORMAL LOW (ref 15–37)
Albumin/Globulin Ratio: 0.7 — ABNORMAL LOW (ref 1.2–3.5)
Albumin: 3.5 g/dL (ref 3.5–5.0)
Alkaline Phosphatase: 77 U/L (ref 50–136)
Anion Gap: 8 mmol/L (ref 7–16)
BUN: 10 MG/DL (ref 6–23)
CO2: 24 mmol/L (ref 21–32)
Calcium: 8.4 MG/DL (ref 8.3–10.4)
Chloride: 105 mmol/L (ref 98–107)
Creatinine: 0.95 MG/DL (ref 0.6–1.0)
EGFR IF NonAfrican American: >60 mL/min/{1.73_m2} (ref >60)
GFR African American: >60 mL/min/{1.73_m2} (ref >60)
Globulin: 4.8 g/dL — ABNORMAL HIGH (ref 2.3–3.5)
Glucose: 95 mg/dL (ref 65–100)
Potassium: 3.5 mmol/L (ref 3.5–5.1)
Sodium: 137 mmol/L (ref 136–145)
Total Bilirubin: 0.2 MG/DL (ref 0.2–1.1)
Total Protein: 8.3 g/dL — ABNORMAL HIGH (ref 6.3–8.2)

## 2020-05-20 MED ORDER — METRONIDAZOLE 500 MG TAB
500 mg | ORAL_TABLET | Freq: Two times a day (BID) | ORAL | 0 refills | Status: AC
Start: 2020-05-20 — End: 2020-05-27

## 2020-05-20 MED ORDER — SODIUM CHLORIDE 0.9 % IJ SYRG
INTRAMUSCULAR | Status: DC | PRN
Start: 2020-05-20 — End: 2020-05-20

## 2020-05-20 MED ORDER — SODIUM CHLORIDE 0.9 % IJ SYRG
Freq: Three times a day (TID) | INTRAMUSCULAR | Status: DC
Start: 2020-05-20 — End: 2020-05-20

## 2020-05-20 NOTE — ED Notes (Signed)
 I have reviewed discharge instructions with the patient.  The patient verbalized understanding.    Patient left ED via Discharge Method: ambulatory to Home with self.    Opportunity for questions and clarification provided.       Patient given 0 scripts. No e-sign.        To continue your aftercare when you leave the hospital, you may receive an automated call from our care team to check in on how you are doing.  This is a free service and part of our promise to provide the best care and service to meet your aftercare needs." If you have questions, or wish to unsubscribe from this service please call [phone].  Thank you for Choosing our [facility] Emergency Department.      1 prescription given to pt at time of discharge.

## 2020-05-20 NOTE — ED Provider Notes (Signed)
ED Provider Notes by Neya Creegan, Berline Lopes, PA at 05/20/20 0200                Author: Taunya Goral, Berline Lopes, PA  Service: Emergency Medicine  Author Type: Physician Assistant       Filed: 05/20/20 0210  Date of Service: 05/20/20 0200  Status: Attested           Editor: Zilpha Mcandrew, Berline Lopes, PA (Physician Assistant)  Cosigner: Debera Lat, DO at 05/20/20 6979          Attestation signed by Debera Lat, DO at 05/20/20 [phone]          I was available in the ED, but did not materially participate in the care of this patient.    [place], DO.                                 Mask was worn during the entire patient examination.      Michelle Shaw is a 30 y.o. female who presents to the ED with a chief complaint of bilateral pelvic/suprapubic pain for the past week.  Patient reports that started suddenly and has persisted since that time.  She states a little bit of nausea but denies  any vomiting, constipation, or diarrhea.  She states it does not really radiate around to her back very much but more or less comes around to her bilateral hips.  She denies any numbness or tingling or any radiating pain down into her legs.  She denies  any fevers, chills, change with eating or drinking.  Denies any significant medical history.  Ports her last period was at the end of March/early April.         The history is provided by the patient.    Abdominal Pain   This is a new problem. The  current episode started more than 1 week ago. The problem  occurs constantly. The problem has not changed since  onset.Associated symptoms include abdominal pain. Pertinent  negatives include no chest pain, no headaches and no shortness of breath. Nothing aggravates the symptoms. Nothing relieves the symptoms. She has tried nothing for the symptoms.             Past Medical History:        Diagnosis  Date         ?  Asthma               Past Surgical History:         Procedure  Laterality  Date          ?  HX GYN               c/s             No family history on file.        Social History          Socioeconomic History         ?  Marital status:  SINGLE              Spouse name:  Not on file         ?  Number of children:  Not on file     ?  Years of education:  Not on file     ?  Highest education level:  Not on file       Occupational History        ?  Not on file       Tobacco Use         ?  Smoking status:  Current Every Day Smoker              Packs/day:  0.50         ?  Smokeless tobacco:  Never Used       Substance and Sexual Activity         ?  Alcohol use:  Yes     ?  Drug use:  No     ?  Sexual activity:  Yes              Birth control/protection:  None        Other Topics  Concern        ?  Not on file       Social History Narrative        ?  Not on file          Social Determinants of Health          Financial Resource Strain:         ?  Difficulty of Paying Living Expenses: Not on file       Food Insecurity:         ?  Worried About Running Out of Food in the Last Year: Not on file     ?  Ran Out of Food in the Last Year: Not on file       Transportation Needs:         ?  Lack of Transportation (Medical): Not on file     ?  Lack of Transportation (Non-Medical): Not on file       Physical Activity:         ?  Days of Exercise per Week: Not on file     ?  Minutes of Exercise per Session: Not on file       Stress:         ?  Feeling of Stress : Not on file       Social Connections:         ?  Frequency of Communication with Friends and Family: Not on file     ?  Frequency of Social Gatherings with Friends and Family: Not on file     ?  Attends Religious Services: Not on file     ?  Active Member of Clubs or Organizations: Not on file     ?  Attends Archivist Meetings: Not on file     ?  Marital Status: Not on file       Intimate Partner Violence:         ?  Fear of Current or Ex-Partner: Not on file     ?  Emotionally Abused: Not on file     ?  Physically Abused: Not on file     ?  Sexually Abused: Not on  file       Housing Stability:         ?  Unable to Pay for Housing in the Last Year: Not on file     ?  Number of Places Lived in the Last Year: Not on file        ?  Unstable Housing in the Last Year: Not on file              ALLERGIES: Patient has no known allergies.  Review of Systems    Constitutional: Negative for chills and fever.    Respiratory: Negative for cough and shortness of breath.     Cardiovascular: Negative for chest pain.    Gastrointestinal: Positive for abdominal pain. Negative for constipation, diarrhea, nausea and vomiting.    Genitourinary: Negative for dysuria, frequency and urgency.    Musculoskeletal: Negative for back pain and gait problem.    Skin: Negative for rash.    Neurological: Negative for headaches.    Psychiatric/Behavioral: Negative for agitation and behavioral problems.    All other systems reviewed and are negative.           Vitals:           05/19/20 2314  05/20/20 0122         BP:  117/71  (!) 114/56     Pulse:  86  76     Resp:  16  18     Temp:  98 ??F (36.7 ??C)       SpO2:  99%  99%     Weight:  122.5 kg (270 lb)           Height:  _0  (1.727 m)                  Physical Exam   Vitals and nursing note reviewed.   Constitutional:        General: She is not in acute distress.     Appearance: Normal appearance. She is well-developed. She is not ill-appearing.    HENT:       Head: Normocephalic and atraumatic.      Right Ear: External ear normal.      Left Ear: External ear normal.    Eyes:       Extraocular Movements: Extraocular movements intact.      Conjunctiva/sclera: Conjunctivae normal.   Cardiovascular:       Rate and Rhythm: Normal rate and regular rhythm.      Pulses: Normal pulses.      Heart sounds: Normal heart sounds.    Pulmonary:       Effort: Pulmonary effort is normal.      Breath sounds: Normal breath sounds.   Abdominal :      General: Abdomen is flat. Bowel sounds are normal.      Palpations: Abdomen is soft.      Tenderness: There is abdominal  tenderness  in the suprapubic area. There is no right CVA tenderness, left CVA tenderness, guarding or rebound.     Musculoskeletal:          General: Normal range of motion.      Cervical back: Normal range of motion.    Skin:      General: Skin is warm and dry.      Capillary Refill: Capillary refill takes less than 2 seconds.    Neurological:       General: No focal deficit present.      Mental Status: She is alert and oriented to person, place, and time.    Psychiatric:         Mood and Affect: Mood normal.         Behavior: Behavior normal.              MDM   Number of Diagnoses or Management Options   Abdominal pain, unspecified abdominal location   Pregnancy test performed, pregnancy  confirmed   Diagnosis management comments: Patient is a 30 year old  female who presents to facility today for bilateral pelvic/suprapubic discomfort for the past week.  On exam patient is well-appearing in  no acute distress with stable vital signs.  Work-up in the department noted a positive urine pregnancy and a beta-hCG of 225.  Pelvic ultrasound was obtained which noted a 0.4 cm hypoechoic structure within the endometrium which may represent a gestational  sac.  Also noted a possible corpus luteal cyst on the left ovary.  Clue cells present on wet prep.  Plan for the patient at this time is to treat her with Flagyl for her BV.  Recommend she find an OB/GYN to establish with and recommended a repeat beta-hCG  in the next 3 to 4 days to ensure appropriate trending as well as repeat imaging here in the coming future for better analysis and to ensure viable pregnancy and not an ectopic.  All of this was discussed with the patient here today to which she reports  understanding.  Patient will seek for an OB/GYN and unable to find one in the next several days will return for the repeat beta-hCG here in the department.  Patient is ambulatory upon discharge in stable condition.  This plan of care for the patient was  reviewed with  Dr. Eppie Gibson, my ED attending, here today in the department and he is in agreement.      Voice dictation software was used during the making of this note.  This software is not perfect and grammatical and other typographical errors may be present.  This note has been proofread, but may still contain errors.   Berline Lopes Diago Haik, PA; 05/20/2020 '@2'$ :09 AM    ===================================================================             Amount and/or Complexity of Data Reviewed   Clinical lab tests: ordered and reviewed   Tests in the radiology section of CPT??: ordered and reviewed   Tests in the medicine section of CPT??: reviewed and ordered    Review and summarize past medical records: yes   Discuss the patient with other providers: yes  (Dr. Eppie Gibson (ED Attending))   Independent visualization of images, tracings, or specimens: yes      Risk of Complications, Morbidity, and/or Mortality   Presenting problems: moderate  Diagnostic procedures: moderate  Management options: moderate  General  comments: [place] PREG UTS LTD    Final Result     There is a 0.4 cm hypoechoic structure within the endometrium which may     represent a gestational sac. This is too small for dates.          A structure which may represent a corpus luteal cyst is seen in the left ovary.     This can be followed up. The right ovary was not visualized.          Other findings as above.        The patient was observed in the ED.      Results Reviewed:         Recent Results (from the past 24 hour(s))   -HCG URINE, QL. - POC:    Collection Time: 05/19/20 11:18 PM        Result                      Value             Ref Range            Pregnancy test,urine Marland Kitchen  Positive (A)      NEG             -CBC WITH AUTOMATED DIFF:    Collection Time: 05/19/20 11:33 PM        Result                      Value             Ref Range            WBC                         6.1               4.3 - 11.1 K*        RBC                         4.38              4.05 - 5.2  M*        HGB                         11.7              11.7 - 15.4 *        HCT                         36.4              35.8 - 46.3 %        MCV                         83.1              79.6 - 97.8 *        MCH                         26.7              26.1 - 32.9 *        MCHC                        32.1              31.4 - 35.0 *        RDW                         12.3              11.9 - 14.6 %        PLATELET                    243               150 - 450 K/*        MPV                         11.0              9.4 - 12.3 FL        ABSOLUTE NRBC  0.00              0.0 - 0.2 K/*        DF                          AUTOMATED                              NEUTROPHILS                 67                43 - 78 %            LYMPHOCYTES                 25                13 - 44 %            MONOCYTES                   6                 4.0 - 12.0 %         EOSINOPHILS                 2                 0.5 - 7.8 %          BASOPHILS                   0                 0.0 - 2.0 %          IMMATURE GRANULOCYTES       0                 0.0 - 5.0 %          ABS. NEUTROPHILS            4.0               1.7 - 8.2 K/*        ABS. LYMPHOCYTES            1.5               0.5 - 4.6 K/*        ABS. MONOCYTES              0.3               0.1 - 1.3 K/*        ABS. EOSINOPHILS            0.1               0.0 - 0.8 K/*        ABS. BASOPHILS              0.0               0.0 - 0.2 K/*        ABS. IMM. GRANS.            0.0               0.0 - 0.5 K/*   -METABOLIC PANEL, COMPREHENSIVE:    Collection  Time: 05/19/20 11:33 PM        Result                      Value             Ref Range            Sodium                      137               136 - 145 mm*        Potassium                   3.5               3.5 - 5.1 mm*        Chloride                    105               98 - 107 mmo*        CO2                         24                21 - 32 mmol*        Anion gap                   8                 7 - 16  mmol/L        Glucose                     95                65 - 100 mg/*        BUN                         10                6 - 23 MG/DL         Creatinine                  0.95              0.6 - 1.0 MG*        GFR est AA                  >60               >60 ml/min/1*        GFR est non-AA              >60               >60 ml/min/1*        Calcium                     8.4               8.3 - 10.4 M*        Bilirubin, total            0.2  0.2 - 1.1 MG*        ALT (SGPT)                  19                12 - 65 U/L          AST (SGOT)                  13 (L)            15 - 37 U/L          Alk. phosphatase            77                50 - 136 U/L         Protein, total              8.3 (H)           6.3 - 8.2 g/*        Albumin                     3.5               3.5 - 5.0 g/*        Globulin                    4.8 (H)           2.3 - 3.5 g/*        A-G Ratio                   0.7 (L)           1.2 - 3.5       -LIPASE:    Collection Time: 05/19/20 11:33 PM        Result                      Value             Ref Range            Lipase                      61 (L)            73 - 393 U/L    -BETA HCG, QT:    Collection Time: 05/19/20 11:33 PM        Result                      Value             Ref Range            Beta HCG, QT                225 (H)           0.0 - 6.0 MI*   -WET PREP:    Collection Time: 05/20/20 12:57 AM   Specimen: Cervical        Result                      Value             Ref Range            Special Requests:  NO SPECIAL REQUESTS        Wet prep                                                       CLUE CELLS PRESENT MODERATE         Wet prep                                                       WBCS 0 TO 5 PER HPF        Wet prep                                                       NO YEAST SEEN NO TRICHOMONAS SEEN       I discussed the results of all labs, procedures, radiographs, and treatments with the patient and  available family.  Treatment plan is agreed upon and the patient is ready for discharge.  All voiced understanding of the discharge plan and medication instructions  or changes as appropriate.  Questions about treatment in the ED were answered.  All were encouraged to return should symptoms worsen or new problems develop.         Patient Progress   Patient progress: stable             Procedures

## 2020-05-23 LAB — CHLAMYDIA / GC-AMPLIFIED
CHLAMYDIA TRACHOMATIS, NAA, 188078: NEGATIVE
Chlamydia trachomatis, NAA: NEGATIVE
NEISSERIA GONORRHOEAE, NAA, 188086: NEGATIVE
Neisseria gonorrhoeae, NAA: NEGATIVE

## 2020-05-24 ENCOUNTER — Inpatient Hospital Stay
Admit: 2020-05-24 | Discharge: 2020-05-24 | Disposition: A | Discharge status: Home / Self Care | Attending: Emergency Medicine

## 2020-05-24 DIAGNOSIS — O26891 Other specified pregnancy related conditions, first trimester: Secondary | ICD-10-CM

## 2020-05-24 LAB — BETA HCG, QT
Beta HCG, QT: 1952 m[IU]/mL — ABNORMAL HIGH (ref 0.0–6.0)
hCG Quant: 1952 m[IU]/mL — ABNORMAL HIGH (ref 0.0–6.0)

## 2020-05-24 MED ORDER — ONDANSETRON HCL 8 MG TAB
8 mg | ORAL_TABLET | Freq: Three times a day (TID) | ORAL | 0 refills | Status: AC | PRN
Start: 2020-05-24 — End: ?

## 2020-05-24 NOTE — ED Provider Notes (Signed)
[facility] EMERGENCY DEPARTMENT     Michelle Shaw is a 30 y.o. female seen on 05/24/2020 in the [facility] EMERGENCY DEPT in room HG/G.    Chief Complaint   Patient presents with   ??? Labs Only     HPI:   30 year old African-American female presented emergency department to get repeat beta-hCG levels.  Patient was seen in the emergency department on 05/19/2020.  Beta hCG levels at that time were low.  Patient was having abdominal cramping but that has subsided.  Patient has had some nausea without vomiting.  She has no other complaints at this time.  Patient was not able to get in with OB as referred so she presented tonight to have repeat beta levels.    Historian: Patient/previous medical record    REVIEW OF SYSTEMS     Review of Systems   Constitutional: Negative.    HENT: Negative.    Respiratory: Negative.    Cardiovascular: Negative.    Gastrointestinal: Positive for nausea.   Genitourinary: Negative.    Musculoskeletal: Negative.    Skin: Negative.    Neurological: Negative.    Psychiatric/Behavioral: Negative.    All other systems reviewed and are negative.      PAST MEDICAL HISTORY     Past Medical History:   Diagnosis Date   ??? Asthma      Past Surgical History:   Procedure Laterality Date   ??? HX GYN      c/s     Social History     Socioeconomic History   ??? Marital status: SINGLE   Tobacco Use   ??? Smoking status: Current Every Day Smoker     Packs/day: 0.50   ??? Smokeless tobacco: Never Used   Substance and Sexual Activity   ??? Alcohol use: Yes   ??? Drug use: No   ??? Sexual activity: Yes     Birth control/protection: None     Prior to Admission Medications   Prescriptions Last Dose Informant Patient Reported? Taking?   albuterol (PROVENTIL HFA, VENTOLIN HFA, PROAIR HFA) 90 mcg/actuation inhaler   No No   Sig: Take 2 Puffs by inhalation every four (4) hours as needed for Wheezing.   metroNIDAZOLE (FlagyL) 500 mg tablet   No No   Sig: Take 1 Tablet by mouth two (2) times a day for 7 days.       Facility-Administered Medications: None     No Known Allergies     PHYSICAL EXAM       Vitals:    05/24/20 0020   BP: 129/80   Pulse: 93   Resp: 18   Temp: 98.3 ??F (36.8 ??C)   SpO2: 99%    Vital signs were reviewed.     Physical Exam  Vitals and nursing note reviewed.   Constitutional:       General: She is not in acute distress.     Appearance: Normal appearance. She is not ill-appearing or toxic-appearing.   HENT:      Head: Normocephalic and atraumatic.      Mouth/Throat:      Mouth: Mucous membranes are moist.   Eyes:      Extraocular Movements: Extraocular movements intact.   Cardiovascular:      Pulses: Normal pulses.   Abdominal:      Palpations: Abdomen is soft.      Tenderness: There is no abdominal tenderness.   Musculoskeletal:         General: Normal range  of motion.   Skin:     General: Skin is warm and dry.   Neurological:      General: No focal deficit present.      Mental Status: She is alert and oriented to person, place, and time.   Psychiatric:         Mood and Affect: Mood normal.         Behavior: Behavior normal.         Thought Content: Thought content normal.         Judgment: Judgment normal.          MEDICAL DECISION MAKING     ED Course:    Orders Placed This Encounter   ??? BETA HCG, QT   ??? ondansetron hcl (Zofran) 8 mg tablet     Recent Results (from the past 8 hour(s))   BETA HCG, QT    Collection Time: 05/24/20 12:38 AM   Result Value Ref Range    Beta HCG, QT 1,952 (H) 0.0 - 6.0 MIU/ML     No results found.      ED Course as of 05/24/20 0203   Wed May 24, 2020   0158 Patient's beta hCG levels are increasing appropriately.  They are consistent with dates with her last menstrual period being March 30. [JL]      ED Course User Index  [JL] Danford Bad, DO     MDM  Number of Diagnoses or Management Options  [redacted] weeks gestation of pregnancy  Pregnancy test performed, pregnancy confirmed  Diagnosis management comments: 30 year old Afro-American female present emergency department for  repeat beta hCG levels.  Patient levels are increasing appropriately.  Patient is approximately 4 to [redacted] weeks pregnant based on her beta hCGs.  Based on dates, patient is 5 to [redacted] weeks pregnant.  Patient will follow up with her OB/GYN.  Her only complaint today is of nausea.  Patient will be given a prescription for Zofran.  She will return emergency department for any concerns.       Amount and/or Complexity of Data Reviewed  Clinical lab tests: reviewed  Decide to obtain previous medical records or to obtain history from someone other than the patient: yes  Review and summarize past medical records: yes    Patient Progress  Patient progress: stable        Disposition: Discharged  Diagnosis:     ICD-10-CM ICD-9-CM   1. Pregnancy test performed, pregnancy confirmed  Z32.01 V72.42   2. [redacted] weeks gestation of pregnancy  Z3A.01 V22.2     ____________________________________________________________________  A portion of this note was generated using voice recognition dictation software. While the note has been reviewed for accuracy, please note certain words and phrases may not be transcribed as intended and some grammatical and/or typographical errors may be present.

## 2020-05-24 NOTE — ED Notes (Signed)
Pt found out she was pregnant at a previous visit here on 05/20/20.  Pt was told to come back for repeat beta HCG test if she couldn't follow up with OBGYN in time. Pt denies other issues at this time.

## 2020-05-24 NOTE — ED Notes (Signed)
 I have reviewed discharge instructions with the patient.  The patient verbalized understanding.    Patient left ED via Discharge Method: ambulatory to Home with friend.    Opportunity for questions and clarification provided.       Patient given 1 scripts.         To continue your aftercare when you leave the hospital, you may receive an automated call from our care team to check in on how you are doing.  This is a free service and part of our promise to provide the best care and service to meet your aftercare needs." If you have questions, or wish to unsubscribe from this service please call [phone].  Thank you for Choosing our [facility] Emergency Department.

## 2020-05-24 NOTE — ED Notes (Signed)
Report given to [facility], Charity fundraiser. Care transferred at this time. No acute distress present

## 2020-05-28 ENCOUNTER — Emergency Department

## 2020-05-28 DIAGNOSIS — O26891 Other specified pregnancy related conditions, first trimester: Secondary | ICD-10-CM

## 2020-05-28 NOTE — ED Notes (Signed)
Pt is approx 5-6W gestation in 3rd pregnancy. Pt scheduled with [facility]. Pt states she has been wiping light pink with severe abdominal cramps for 3-4 days. PMH 1 miscarriage.

## 2020-05-29 ENCOUNTER — Emergency Department

## 2020-05-29 ENCOUNTER — Inpatient Hospital Stay
Admit: 2020-05-29 | Discharge: 2020-05-29 | Discharge status: Against Medical Advice | Attending: Student in an Organized Health Care Education/Training Program

## 2020-05-29 LAB — CBC WITH AUTOMATED DIFF
ABS. BASOPHILS: 0 10*3/uL (ref 0.0–0.2)
ABS. EOSINOPHILS: 0.2 10*3/uL (ref 0.0–0.8)
ABS. IMM. GRANS.: 0.1 10*3/uL (ref 0.0–0.5)
ABS. LYMPHOCYTES: 1.9 10*3/uL (ref 0.5–4.6)
ABS. MONOCYTES: 0.4 10*3/uL (ref 0.1–1.3)
ABS. NEUTROPHILS: 2.8 10*3/uL (ref 1.7–8.2)
ABSOLUTE NRBC: 0 10*3/uL (ref 0.0–0.2)
BASOPHILS: 1 % (ref 0.0–2.0)
EOSINOPHILS: 4 % (ref 0.5–7.8)
HCT: 36.3 % (ref 35.8–46.3)
HGB: 11.7 g/dL (ref 11.7–15.4)
IMMATURE GRANULOCYTES: 1 % (ref 0.0–5.0)
LYMPHOCYTES: 36 % (ref 13–44)
MCH: 26.8 PG (ref 26.1–32.9)
MCHC: 32.2 g/dL (ref 31.4–35.0)
MCV: 83.3 FL (ref 79.6–97.8)
MONOCYTES: 7 % (ref 4.0–12.0)
MPV: 10.6 FL (ref 9.4–12.3)
NEUTROPHILS: 51 % (ref 43–78)
PLATELET: 238 10*3/uL (ref 150–450)
RBC: 4.36 M/uL (ref 4.05–5.2)
RDW: 12.7 % (ref 11.9–14.6)
WBC: 5.4 10*3/uL (ref 4.3–11.1)

## 2020-05-29 LAB — URINE MICROSCOPIC

## 2020-05-29 LAB — METABOLIC PANEL, BASIC
Anion gap: 9 mmol/L (ref 7–16)
BUN: 10 MG/DL (ref 6–23)
CO2: 25 mmol/L (ref 21–32)
Calcium: 8.9 MG/DL (ref 8.3–10.4)
Chloride: 103 mmol/L (ref 98–107)
Creatinine: 0.72 MG/DL (ref 0.6–1.0)
GFR est AA: >60 mL/min/{1.73_m2} (ref >60)
GFR est non-AA: >60 mL/min/{1.73_m2} (ref >60)
Glucose: 92 mg/dL (ref 65–100)
Potassium: 3.6 mmol/L (ref 3.5–5.1)
Sodium: 137 mmol/L (ref 136–145)

## 2020-05-29 LAB — BLOOD TYPE, (ABO+RH)
ABO/Rh(D): A POS
ABO/Rh: A POS

## 2020-05-29 LAB — HCG URINE, QL. - POC
HCG, Pregnancy, Urine, POC: POSITIVE — AB
Pregnancy test,urine (POC): POSITIVE — AB

## 2020-05-29 LAB — BETA HCG, QT
Beta HCG, QT: 9010 m[IU]/mL — ABNORMAL HIGH (ref 0.0–6.0)
hCG Quant: 9010 m[IU]/mL — ABNORMAL HIGH (ref 0.0–6.0)

## 2020-05-29 LAB — CBC WITH AUTO DIFFERENTIAL
Basophils %: 1 % (ref 0.0–2.0)
Basophils Absolute: 0 10*3/uL (ref 0.0–0.2)
Eosinophils %: 4 % (ref 0.5–7.8)
Eosinophils Absolute: 0.2 10*3/uL (ref 0.0–0.8)
Granulocyte Absolute Count: 0.1 10*3/uL (ref 0.0–0.5)
Hematocrit: 36.3 % (ref 35.8–46.3)
Hemoglobin: 11.7 g/dL (ref 11.7–15.4)
Immature Granulocytes: 1 % (ref 0.0–5.0)
Lymphocytes %: 36 % (ref 13–44)
Lymphocytes Absolute: 1.9 10*3/uL (ref 0.5–4.6)
MCH: 26.8 PG (ref 26.1–32.9)
MCHC: 32.2 g/dL (ref 31.4–35.0)
MCV: 83.3 FL (ref 79.6–97.8)
MPV: 10.6 FL (ref 9.4–12.3)
Monocytes %: 7 % (ref 4.0–12.0)
Monocytes Absolute: 0.4 10*3/uL (ref 0.1–1.3)
NRBC Absolute: 0 10*3/uL (ref 0.0–0.2)
Neutrophils %: 51 % (ref 43–78)
Neutrophils Absolute: 2.8 10*3/uL (ref 1.7–8.2)
Platelets: 238 10*3/uL (ref 150–450)
RBC: 4.36 M/uL (ref 4.05–5.2)
RDW: 12.7 % (ref 11.9–14.6)
WBC: 5.4 10*3/uL (ref 4.3–11.1)

## 2020-05-29 LAB — BASIC METABOLIC PANEL
Anion Gap: 9 mmol/L (ref 7–16)
BUN: 10 MG/DL (ref 6–23)
CO2: 25 mmol/L (ref 21–32)
Calcium: 8.9 MG/DL (ref 8.3–10.4)
Chloride: 103 mmol/L (ref 98–107)
Creatinine: 0.72 MG/DL (ref 0.6–1.0)
EGFR IF NonAfrican American: >60 mL/min/{1.73_m2} (ref >60)
GFR African American: >60 mL/min/{1.73_m2} (ref >60)
Glucose: 92 mg/dL (ref 65–100)
Potassium: 3.6 mmol/L (ref 3.5–5.1)
Sodium: 137 mmol/L (ref 136–145)

## 2020-05-29 MED ORDER — ACETAMINOPHEN 500 MG TAB
500 mg | ORAL | Status: AC
Start: 2020-05-29 — End: 2020-05-29
  Administered 2020-05-29: 05:00:00 via ORAL

## 2020-05-29 MED ORDER — SODIUM CHLORIDE 0.9 % IJ SYRG
Freq: Three times a day (TID) | INTRAMUSCULAR | Status: DC
Start: 2020-05-29 — End: 2020-05-29

## 2020-05-29 MED ORDER — SODIUM CHLORIDE 0.9 % IJ SYRG
INTRAMUSCULAR | Status: DC | PRN
Start: 2020-05-29 — End: 2020-05-29

## 2020-05-29 MED ORDER — SODIUM CHLORIDE 0.9 % IV PIGGY BACK
1 gram | INTRAVENOUS | Status: AC
Start: 2020-05-29 — End: 2020-05-29
  Administered 2020-05-29: 06:00:00 via INTRAVENOUS

## 2020-05-29 MED ORDER — SODIUM CHLORIDE 0.9% BOLUS IV
0.9 % | Freq: Once | INTRAVENOUS | Status: AC
Start: 2020-05-29 — End: 2020-05-29
  Administered 2020-05-29: 05:00:00 via INTRAVENOUS

## 2020-05-29 MED FILL — TYLENOL EXTRA STRENGTH 500 MG TABLET: 500 mg | ORAL | Qty: 2

## 2020-05-29 MED FILL — CEFTRIAXONE 1 GRAM SOLUTION FOR INJECTION: 1 gram | INTRAMUSCULAR | Qty: 1

## 2020-05-29 NOTE — ED Provider Notes (Signed)
30 year old G3 P1-0-1-1 presents to the ER approximately 5 to [redacted] weeks pregnant with reports of abdominal pain and pink discharge.  Patient states symptoms started yesterday.  They have been fairly constant nature since onset.  She describes generalized cramping sensation throughout the lower abdominal quadrants.  She states when she wipes she notices pink-tinged, otherwise clear discharge present.  She reports a long history of clear vaginal discharge which did not allow to alarm her at first however when she noted the pink tinge after wiping, this prompted her visit to this department.  She reports nausea without vomiting and denies any fever or chills.  She reports normal bowel and bladder habits.  [facility] but has not completed this appointment thus far.    The history is provided by the patient. No language interpreter was used.   Pregnancy Problem   Associated symptoms include nausea. Pertinent negatives include no fever, no diarrhea, no vomiting, no dysuria, no hematuria, no headaches, no myalgias, no chest pain and no back pain.        Past Medical History:   Diagnosis Date   ??? Asthma        Past Surgical History:   Procedure Laterality Date   ??? HX GYN      c/s         History reviewed. No pertinent family history.    Social History     Socioeconomic History   ??? Marital status: SINGLE     Spouse name: Not on file   ??? Number of children: Not on file   ??? Years of education: Not on file   ??? Highest education level: Not on file   Occupational History   ??? Not on file   Tobacco Use   ??? Smoking status: Former Smoker     Packs/day: 0.50     Quit date: 03/05/2020     Years since quitting: 0.2   ??? Smokeless tobacco: Never Used   Substance and Sexual Activity   ??? Alcohol use: Yes   ??? Drug use: No   ??? Sexual activity: Yes     Birth control/protection: None   Other Topics Concern   ??? Not on file   Social History Narrative   ??? Not on file     Social Determinants of Health     Financial Resource  Strain:    ??? Difficulty of Paying Living Expenses: Not on file   Food Insecurity:    ??? Worried About Running Out of Food in the Last Year: Not on file   ??? Ran Out of Food in the Last Year: Not on file   Transportation Needs:    ??? Lack of Transportation (Medical): Not on file   ??? Lack of Transportation (Non-Medical): Not on file   Physical Activity:    ??? Days of Exercise per Week: Not on file   ??? Minutes of Exercise per Session: Not on file   Stress:    ??? Feeling of Stress : Not on file   Social Connections:    ??? Frequency of Communication with Friends and Family: Not on file   ??? Frequency of Social Gatherings with Friends and Family: Not on file   ??? Attends Religious Services: Not on file   ??? Active Member of Clubs or Organizations: Not on file   ??? Attends Banker Meetings: Not on file   ??? Marital Status: Not on file   Intimate Partner Violence:    ???  Fear of Current or Ex-Partner: Not on file   ??? Emotionally Abused: Not on file   ??? Physically Abused: Not on file   ??? Sexually Abused: Not on file   Housing Stability:    ??? Unable to Pay for Housing in the Last Year: Not on file   ??? Number of Places Lived in the Last Year: Not on file   ??? Unstable Housing in the Last Year: Not on file         ALLERGIES: Patient has no known allergies.    Review of Systems   Constitutional: Negative for chills, diaphoresis and fever.   HENT: Negative for congestion, sneezing and sore throat.    Eyes: Negative for visual disturbance.   Respiratory: Negative for cough, chest tightness, shortness of breath and wheezing.    Cardiovascular: Negative for chest pain and leg swelling.   Gastrointestinal: Positive for nausea. Negative for abdominal pain, blood in stool, diarrhea and vomiting.   Endocrine: Negative for polyuria.   Genitourinary: Positive for pelvic pain. Negative for difficulty urinating, dysuria, flank pain, hematuria and urgency.   Musculoskeletal: Negative for back pain, myalgias, neck pain and neck stiffness.    Skin: Negative for color change and rash.   Neurological: Negative for dizziness, syncope, speech difficulty, weakness, light-headedness, numbness and headaches.   Psychiatric/Behavioral: Negative for behavioral problems.   All other systems reviewed and are negative.      Vitals:    05/28/20 2309   BP: (!) 123/55   Pulse: 83   Resp: 16   Temp: 98.8 ??F (37.1 ??C)   SpO2: 99%   Weight: 122.5 kg (270 lb)   Height: 5\' 8"  (1.727 m)            Physical Exam  Vitals and nursing note reviewed.   Constitutional:       General: She is not in acute distress.     Appearance: She is well-developed. She is not diaphoretic.      Comments: Very well-appearing female patient.  Alert and oriented to person place and time.  No acute distress, speaks in clear, fluid sentences.   HENT:      Head: Normocephalic and atraumatic.      Right Ear: External ear normal.      Left Ear: External ear normal.      Nose: Nose normal.   Eyes:      Pupils: Pupils are equal, round, and reactive to light.   Cardiovascular:      Rate and Rhythm: Normal rate and regular rhythm.      Heart sounds: Normal heart sounds. No murmur heard.  No friction rub. No gallop.    Pulmonary:      Effort: Pulmonary effort is normal. No respiratory distress.      Breath sounds: Normal breath sounds. No stridor. No decreased breath sounds, wheezing, rhonchi or rales.   Chest:      Chest wall: No tenderness.   Abdominal:      General: There is no distension.      Palpations: Abdomen is soft. There is no mass.      Tenderness: There is abdominal tenderness in the right lower quadrant, suprapubic area and left lower quadrant. There is no guarding or rebound.      Hernia: No hernia is present.      Comments: Mild tenderness reported subjectively along the lower abdominal quadrants.  No rebound guarding or distention.   Musculoskeletal:  General: No tenderness or deformity. Normal range of motion.      Cervical back: Normal range of motion.   Skin:     General: Skin is  warm and dry.   Neurological:      Mental Status: She is alert and oriented to person, place, and time.      Cranial Nerves: No cranial nerve deficit.          MDM  Number of Diagnoses or Management Options  Diagnosis management comments: This is patient's third visit in 10 days for pregnancy related complaints.  Orders placed for labs to include beta hCG, type and screen, fluids and acetaminophen.  Patient required to urinalysis on arrival.  Patient reassessed, discussed urinalysis results and plans to obtain ultrasound imaging.    Unfortunately, patient eloped prior to our being able to obtain a ultrasound.        Amount and/or Complexity of Data Reviewed  Clinical lab tests: ordered and reviewed  Tests in the radiology section of CPT??: ordered and reviewed  Tests in the medicine section of CPT??: ordered and reviewed  Review and summarize past medical records: (EXAMINATION: [place] [facility]  ??  HISTORY: pelvic pain x 1 week.   ??  TECHNIQUE: Grayscale sonographic evaluation of the pelvis supplemented with  color and spectral Doppler as needed. Transabdominal and transvaginal images  were obtained.  ??  COMPARISON: None available.  ??  FINDINGS:   The uterus is anteverted and measures 9.0 x 4.7 x 5.6 cm. There is a 1.4 x 1.1 x  1.0 cm hypoechoic lesion in the posterior uterine body. This may represent a  small fibroid.  ??  The endometrium measures 14 mm. There is a 0.3 x 0.2 x 0.4 cm hypoechoic  structure within the endometrium which may represent a tiny gestational sac.  This is to small for dates.    ??  Clinical dating of 5 weeks and 1 day based on LMP 04/13/2020 (clinical EDD  01/18/2021).  ??  A right ovary was not visualized. The left ovary measures 3.9 x 2.4 x 2.4 cm. It  contains a 1.6 x 1.5 x 1.8 cm complex structure which may represent a corpus  luteal cyst. The left ovary shows ovarian blood flow.  ??  No free fluid within the visualized pelvis or adnexa.  ??  IMPRESSION  There is a 0.4 cm hypoechoic structure  within the endometrium which may  represent a gestational sac. This is too small for dates.  ??  A structure which may represent a corpus luteal cyst is seen in the left ovary.  This can be followed up. The right ovary was not visualized.  ??  Other findings as above. )  Independent visualization of images, tracings, or specimens: yes    Risk of Complications, Morbidity, and/or Mortality  Presenting problems: moderate  Diagnostic procedures: low  Management options: moderate    Patient Progress  Patient progress: stable         Procedures

## 2020-05-29 NOTE — ED Notes (Signed)
AMA form signed

## 2020-05-29 NOTE — ED Notes (Signed)
Pt requesting to leave.  States that she has child care issues with another child.  Dr Anise Salvo made aware.

## 2020-05-29 NOTE — ED Notes (Signed)
Pt. Report given to [place], [place]

## 2020-06-06 ENCOUNTER — Inpatient Hospital Stay
Admit: 2020-06-06 | Discharge: 2020-06-07 | Disposition: A | Discharge status: Home / Self Care | Attending: Emergency Medicine

## 2020-06-06 ENCOUNTER — Emergency Department: Admit: 2020-06-06 | Primary: Sports Medicine

## 2020-06-06 DIAGNOSIS — M25512 Pain in left shoulder: Secondary | ICD-10-CM
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
## 2020-06-06 NOTE — ED Provider Notes (Signed)
Michelle Deblois Tremayn Dezire Turk, MD  06/08/20 0946
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
## 2020-06-06 NOTE — ED Notes (Signed)
Report given to Roxy RN     Ruther Ephraim, RN  06/06/20 1954
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
## 2020-06-06 NOTE — ED Notes (Signed)
I and the PA have reviewed discharge instructions with the patient.  The patient verbalized understanding.    Patient left ED via Discharge Method: ambulatory to Home with (self).    Opportunity for questions and clarification provided.       Patient given 1 scripts.         To continue your aftercare when you leave the hospital, you may receive an automated call from our care team to check in on how you are doing.  This is a free service and part of our promise to provide the best care and service to meet your aftercare needs.??? If you have questions, or wish to unsubscribe from this service please call [phone].  Thank you for Choosing our [facility] Emergency Department.        Avelino Leeds, RN  06/06/20 2026
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
## 2020-06-07 MED ORDER — MELOXICAM 15 MG PO TABS
15 MG | ORAL_TABLET | Freq: Every day | ORAL | 0 refills | Status: AC
Start: 2020-06-07 — End: 2020-06-13

## 2020-06-22 ENCOUNTER — Encounter: Attending: Obstetrics & Gynecology | Primary: Sports Medicine

## 2020-10-31 ENCOUNTER — Inpatient Hospital Stay
Admit: 2020-10-31 | Discharge: 2020-10-31 | Disposition: A | Discharge status: Home / Self Care | Attending: Emergency Medicine

## 2020-10-31 DIAGNOSIS — U071 COVID-19: Secondary | ICD-10-CM

## 2020-10-31 LAB — COVID-19, RAPID: SARS-CoV-2, Rapid: DETECTED — CR

## 2020-10-31 LAB — RAPID INFLUENZA A/B ANTIGENS
Influenza A Ag: NEGATIVE
Influenza B Ag: NEGATIVE

## 2020-10-31 MED ORDER — NIRMATRELVIR&RITONAVIR 300/100 20 X 150 MG & 10 X 100MG PO TBPK
2015010100 x 150 MG & 10 x 100MG | ORAL_TABLET | ORAL | 0 refills | Status: AC
Start: 2020-10-31 — End: 2020-11-05

## 2020-10-31 NOTE — ED Provider Notes (Signed)
Emergency Department Provider Note                   PCP:                None Provider               Age: 30 y.o.      Sex: female       ICD-10-CM    1. COVID-19  U45.1           DISPOSITION Decision To Discharge 10/31/2020 01:38:47 PM       MDM  Number of Diagnoses or Management Options  Diagnosis management comments: 30 year old female presents emergency department with chief complaint of fatigue and body aches.    Differential COVID-19, influenza, common cold             ED Course as of 10/31/20 1408   Tue Oct 31, 2020   1324 SARS-CoV-2, Rapid(!!): Detected [ET]      ED Course User Index  [ET] Frederik Schmidt, PA        Orders Placed This Encounter   Procedures    Rapid influenza A/B antigens    COVID-19, Rapid        Medications - No data to display    Discharge Medication List as of 10/31/2020  2:01 PM        START taking these medications    Details   nirmatrelvir/ritonavir (PAXLOVID) 20 x 150 MG & 10 x 100MG  TBPK Take 3 tablets (two 150 mg nirmatrelvir and one 100 mg ritonavir tablets) by mouth every 12 hours for 5 days., Disp-30 tablet, R-0Print              Michelle Shaw is a 30 y.o. female who presents to the Emergency Department with chief complaint of    Chief Complaint   Patient presents with    Generalized Body Aches    Chills    Cough      30 year old female presenting to the emergency department with chief complaint of congestion and fatigue that has been going on for the past 2 days.  Complains of associated chills, nonproductive cough, sore throat, body aches, and chills.  She denies associated nausea and vomiting, shortness of breath, and chest pain.  States she has had minimal appetite as well.    The history is provided by the patient.     All other systems reviewed and are negative unless otherwise stated in the history of present illness section.    Review of Systems   Constitutional:  Positive for appetite change, chills and fatigue.   Respiratory:  Negative for chest tightness and  shortness of breath.    Cardiovascular:  Negative for chest pain.   Gastrointestinal:  Negative for abdominal pain, nausea and vomiting.   Musculoskeletal:  Positive for arthralgias and myalgias.   All other systems reviewed and are negative.    Past Medical History:   Diagnosis Date    Asthma         Past Surgical History:   Procedure Laterality Date    GYN      c/s        History reviewed. No pertinent family history.     Social History     Socioeconomic History    Marital status: Single     Spouse name: None    Number of children: None    Years of education: None    Highest education level: None  Tobacco Use    Smoking status: Former     Packs/day: 0.50     Types: Cigarettes     Quit date: 03/05/2020     Years since quitting: 0.6    Smokeless tobacco: Never   Substance and Sexual Activity    Alcohol use: Yes    Drug use: No        Allergies: Patient has no known allergies.    Discharge Medication List as of 10/31/2020  2:01 PM        CONTINUE these medications which have NOT CHANGED    Details   meloxicam (MOBIC) 15 MG tablet Take 1 tablet by mouth daily for 7 days, Disp-7 tablet, R-0Print      albuterol sulfate HFA 108 (90 Base) MCG/ACT inhaler Inhale 2 puffs into the lungs every 4 hours as neededHistorical Med              Vitals signs and nursing note reviewed.   Patient Vitals for the past 4 hrs:   Temp Pulse Resp BP SpO2   10/31/20 1228 98.6 ??F (37 ??C) 81 18 118/81 99 %          Physical Exam  Vitals reviewed.   Constitutional:       General: She is not in acute distress.     Appearance: Normal appearance. She is normal weight. She is not ill-appearing.   HENT:      Head: Normocephalic and atraumatic.      Right Ear: Tympanic membrane, ear canal and external ear normal.      Left Ear: Tympanic membrane, ear canal and external ear normal.      Nose: Nose normal. No congestion or rhinorrhea.      Mouth/Throat:      Mouth: Mucous membranes are moist.      Pharynx: Oropharynx is clear. No oropharyngeal exudate  or posterior oropharyngeal erythema.   Eyes:      Extraocular Movements: Extraocular movements intact.      Conjunctiva/sclera: Conjunctivae normal.      Pupils: Pupils are equal, round, and reactive to light.   Cardiovascular:      Rate and Rhythm: Normal rate and regular rhythm.      Pulses: Normal pulses.      Heart sounds: Normal heart sounds. No murmur heard.  Pulmonary:      Effort: Pulmonary effort is normal. No respiratory distress.      Breath sounds: Normal breath sounds. No stridor. No wheezing or rhonchi.   Abdominal:      General: Abdomen is flat. Bowel sounds are normal. There is no distension.      Palpations: Abdomen is soft.      Tenderness: There is no abdominal tenderness. There is no guarding or rebound.   Musculoskeletal:         General: No swelling, deformity or signs of injury. Normal range of motion.      Cervical back: Normal range of motion and neck supple. No rigidity or tenderness.   Skin:     General: Skin is warm and dry.      Coloration: Skin is not pale.      Findings: No erythema, lesion or rash.   Neurological:      General: No focal deficit present.      Mental Status: She is alert and oriented to person, place, and time. Mental status is at baseline.   Psychiatric:         Mood and Affect: Mood  normal.         Behavior: Behavior normal.         Thought Content: Thought content normal.         Judgment: Judgment normal.        Procedures    Results for orders placed or performed during the hospital encounter of 10/31/20   Rapid influenza A/B antigens    Specimen: Nasal Washing   Result Value Ref Range    Influenza A Ag Negative NEG      Influenza B Ag Negative NEG      Source Nasopharyngeal     COVID-19, Rapid    Specimen: Nasopharyngeal   Result Value Ref Range    Source Nasopharyngeal      SARS-CoV-2, Rapid Detected (AA) NOTD          No orders to display                         Voice dictation software was used during the making of this note.  This software is not perfect and  grammatical and other typographical errors may be present.  This note has not been completely proofread for errors.     Frederik Schmidt, PA  10/31/20 1408

## 2020-10-31 NOTE — Discharge Instructions (Addendum)
Your work-up today indicates you have tested positive for COVID-19.  Per the CDC, your symptom onset indicates you must isolate until October 22 and wear a mask around others through October 26.  If your symptoms improve the 22nd and you have been fever free for 24 hours without the use of fever reducing medication you may leave quarantine.  Continue taking over-the-counter medications as needed for symptom improvement.  If you develop any chest pain, shortness of breath, or worsening of other symptoms please return to the emergency department.

## 2020-10-31 NOTE — ED Triage Notes (Signed)
Pt states that on Sunday night she started having body aches, chills, fatigue, and congestion.

## 2020-10-31 NOTE — ED Notes (Signed)
I have reviewed discharge instructions with the patient.  The patient verbalized understanding.    Patient left ED via Discharge Method: ambulatory to Home with self.    Opportunity for questions and clarification provided.       Patient given 1 scripts.         To continue your aftercare when you leave the hospital, you may receive an automated call from our care team to check in on how you are doing.  This is a free service and part of our promise to provide the best care and service to meet your aftercare needs.??? If you have questions, or wish to unsubscribe from this service please call [phone].  Thank you for Choosing our [facility] Emergency Department.          Okey Dupre, RN  10/31/20 1407

## 2020-12-28 ENCOUNTER — Inpatient Hospital Stay: Admit: 2020-12-28 | Primary: Sports Medicine

## 2020-12-28 ENCOUNTER — Inpatient Hospital Stay
Admit: 2020-12-28 | Discharge: 2020-12-28 | Disposition: A | Discharge status: Home / Self Care | Attending: Emergency Medicine

## 2020-12-28 DIAGNOSIS — S39012A Strain of muscle, fascia and tendon of lower back, initial encounter: Secondary | ICD-10-CM

## 2020-12-28 DIAGNOSIS — O26891 Other specified pregnancy related conditions, first trimester: Secondary | ICD-10-CM

## 2020-12-28 LAB — CBC WITH AUTO DIFFERENTIAL
Absolute Eos #: 0.2 10*3/uL (ref 0.0–0.8)
Absolute Immature Granulocyte: 0 10*3/uL (ref 0.0–0.5)
Absolute Lymph #: 2.1 10*3/uL (ref 0.5–4.6)
Absolute Mono #: 0.5 10*3/uL (ref 0.1–1.3)
Basophils Absolute: 0 10*3/uL (ref 0.0–0.2)
Basophils: 0 % (ref 0.0–2.0)
Eosinophils %: 4 % (ref 0.5–7.8)
Hematocrit: 37.6 % (ref 35.8–46.3)
Hemoglobin: 11.8 g/dL (ref 11.7–15.4)
Immature Granulocytes: 0 % (ref 0.0–5.0)
Lymphocytes: 39 % (ref 13–44)
MCH: 27.1 PG (ref 26.1–32.9)
MCHC: 31.4 g/dL (ref 31.4–35.0)
MCV: 86.2 FL (ref 82.0–102.0)
MPV: 10.2 FL (ref 9.4–12.3)
Monocytes: 9 % (ref 4.0–12.0)
Platelets: 242 10*3/uL (ref 150–450)
RBC: 4.36 M/uL (ref 4.05–5.2)
RDW: 13.6 % (ref 11.9–14.6)
Seg Neutrophils: 47 % (ref 43–78)
Segs Absolute: 2.6 10*3/uL (ref 1.7–8.2)
WBC: 5.4 10*3/uL (ref 4.3–11.1)
nRBC: 0 10*3/uL (ref 0.0–0.2)

## 2020-12-28 LAB — URINALYSIS, MICRO
Casts: 0 /lpf
Crystals: 0 /LPF
Mucus, UA: 0 /lpf

## 2020-12-28 LAB — HCG, QUANTITATIVE, PREGNANCY: hCG Quant: 4877 m[IU]/mL — ABNORMAL HIGH (ref 0.0–6.0)

## 2020-12-28 LAB — URINALYSIS
Bilirubin Urine: NEGATIVE
Glucose, UA: NEGATIVE mg/dL
Ketones, Urine: NEGATIVE mg/dL
Nitrite, Urine: NEGATIVE
Protein, UA: NEGATIVE mg/dL
Specific Gravity, UA: 1.016 (ref 1.001–1.023)
Urobilinogen, Urine: 0.2 EU/dL (ref 0.2–1.0)
pH, Urine: 7 (ref 5.0–9.0)

## 2020-12-28 LAB — BASIC METABOLIC PANEL
Anion Gap: 6 mmol/L (ref 2–11)
BUN: 7 MG/DL (ref 6–23)
CO2: 26 mmol/L (ref 21–32)
Calcium: 8.7 MG/DL (ref 8.3–10.4)
Chloride: 106 mmol/L (ref 101–110)
Creatinine: 0.78 MG/DL (ref 0.6–1.0)
Est, Glom Filt Rate: >60 mL/min/{1.73_m2} (ref >60)
Glucose: 69 mg/dL (ref 65–100)
Potassium: 3.7 mmol/L (ref 3.5–5.1)
Sodium: 138 mmol/L (ref 133–143)

## 2020-12-28 LAB — POC PREGNANCY UR-QUAL: Preg Test, Ur: POSITIVE — AB

## 2020-12-28 MED ORDER — CEPHALEXIN 500 MG PO CAPS
500 MG | ORAL_CAPSULE | Freq: Three times a day (TID) | ORAL | 0 refills | Status: AC
Start: 2020-12-28 — End: 2021-01-04

## 2020-12-28 MED ORDER — LIDOCAINE 4 % EX PTCH
4 % | CUTANEOUS | Status: DC
Start: 2020-12-28 — End: 2020-12-28
  Administered 2020-12-28: 20:00:00 1 via TRANSDERMAL

## 2020-12-28 MED FILL — LIDOCAINE PAIN RELIEF 4 % EX PTCH: 4 % | CUTANEOUS | Qty: 1

## 2020-12-28 NOTE — ED Notes (Signed)
I have reviewed discharge instructions with the patient.  The patient verbalized understanding.    Patient left ED via Discharge Method: ambulatory to Home with self.    Opportunity for questions and clarification provided.       Patient given 0 scripts.         To continue your aftercare when you leave the hospital, you may receive an automated call from our care team to check in on how you are doing.  This is a free service and part of our promise to provide the best care and service to meet your aftercare needs.??? If you have questions, or wish to unsubscribe from this service please call [phone].  Thank you for Choosing our [facility] Emergency Department.        Juanda Chance, RN  12/28/20 1712

## 2020-12-28 NOTE — ED Provider Notes (Signed)
Emergency Department Provider Note                   PCP:                Md Loraine Leriche A Demoss (Inactive)               Age: 30 y.o.      Sex: female       ICD-10-CM    1. Back strain, initial encounter  S39.012A       2. Abdominal pain during pregnancy in first trimester  O26.891     R10.9           DISPOSITION Decision To Discharge 12/28/2020 04:58:06 PM        MDM  Number of Diagnoses or Management Options  Abdominal pain during pregnancy in first trimester  Back strain, initial encounter  Diagnosis management comments: 30 year old female in first trimester pregnancy came in with back pain likely due to the fact that she is wearing very poor foot support.  We discussed treatment for back pain including rest ice stretching lidocaine patch and Tylenol.  Patient does state that she stands on hard floor and is wearing unsupportive shoes.  We discussed changing shoes.  She does have reproducible SI pain with no red flag back pain warnings.  She also is pregnant and stated she had some bilateral lower abdominal cramping.  Ultrasound was normal beta-hCG was 4877.  CBC, CHEM, urinalysis may have shown a very subtle UTI.  She received Keflex to her pharmacy..  Patient stable for discharge home.               Orders Placed This Encounter   Procedures    US OB 1 OR MORE FETUS LIMITED    Urinalysis w rflx microscopic    CBC with Auto Differential    BMP    HCG, Quantitative, Pregnancy    Urinalysis, Micro    POC PREGNANCY UR-QUAL    POCT Urine Dipstick    POC Pregnancy Urine Qual        Medications   lidocaine 4 % external patch 1 patch (1 patch TransDERmal Patch Applied 12/28/20 1442)       New Prescriptions    No medications on file        Pearl Bents is a 30 y.o. female who presents to the Emergency Department with chief complaint of    Chief Complaint   Patient presents with    Back Pain      30 year old female presenting with back pain.  Patient states she is approximately [redacted] weeks pregnant.  She describes back pain  worse in the left lower SI region.  Denies any focal neurodeficits.  She states she believes it is from being at work standing on hard surface all day long without much movement.  Denies any chest pain, shortness of breath.  On review of systems she did have minimal bilateral lower abdominal pain.  Denies any vaginal discharge or bleeding.  Denies any fevers,         Review of Systems   HENT: Negative.     Eyes: Negative.    Respiratory: Negative.     Cardiovascular: Negative.    Gastrointestinal:  Positive for abdominal pain.   Genitourinary: Negative.    Musculoskeletal:  Positive for back pain.   Skin: Negative.    Neurological: Negative.    Psychiatric/Behavioral: Negative.       Past Medical History:   Diagnosis  Date    Asthma         Past Surgical History:   Procedure Laterality Date    GYN      c/s        No family history on file.     Social History     Socioeconomic History    Marital status: Single   Tobacco Use    Smoking status: Former     Packs/day: 0.50     Types: Cigarettes     Quit date: 03/05/2020     Years since quitting: 0.8    Smokeless tobacco: Never   Substance and Sexual Activity    Alcohol use: Yes    Drug use: No         Patient has no known allergies.     Previous Medications    ALBUTEROL SULFATE HFA 108 (90 BASE) MCG/ACT INHALER    Inhale 2 puffs into the lungs every 4 hours as needed    MELOXICAM (MOBIC) 15 MG TABLET    Take 1 tablet by mouth daily for 7 days        Vitals signs and nursing note reviewed.   Patient Vitals for the past 4 hrs:   Temp Pulse Resp BP SpO2   12/28/20 1325 98.6 ??F (37 ??C) 82 16 113/71 100 %          Physical Exam  Constitutional:       General: She is not in acute distress.     Appearance: Normal appearance. She is not ill-appearing.   HENT:      Head: Normocephalic and atraumatic.      Nose: No rhinorrhea.      Mouth/Throat:      Mouth: Mucous membranes are moist.   Eyes:      Extraocular Movements: Extraocular movements intact.   Cardiovascular:      Rate and  Rhythm: Normal rate and regular rhythm.   Pulmonary:      Effort: Pulmonary effort is normal.      Breath sounds: Normal breath sounds.   Abdominal:      General: Abdomen is flat. Bowel sounds are normal. There is no distension.      Palpations: Abdomen is soft.      Tenderness: There is no abdominal tenderness.   Musculoskeletal:         General: Normal range of motion.      Cervical back: Normal range of motion.      Comments: There is palpation of her left SI joint   Skin:     General: Skin is warm and dry.   Neurological:      General: No focal deficit present.      Mental Status: She is alert.      Comments: 5 out of 5 strength in lower extremities.  No loss of sensation light touch in lower extremities.  Able to ambulate without any difficulty.   Psychiatric:         Mood and Affect: Mood normal.        Procedures    Results for orders placed or performed during the hospital encounter of 12/28/20   US OB 1 OR MORE FETUS LIMITED    Narrative    Pelvic ultrasound    CLINICAL INDICATION: Back pain for five weeks in a first trimester pregnant  patient.    PROCEDURE: Realtime grayscale and color Doppler sonographic evaluation the  pelvis via a transabdominal and endovaginal approach.    COMPARISON:  None.    FINDINGS:    Transabdominal exam: The bladder is distended. The uterus measures 9.5 cm. There  is a gestational sac present within the uterus.    Endovaginal exam: The gestational sac is better characterized. There is a small  yolk sac within the gestational sac. A fetal pole is appreciated. It has a  crown-rump length that corresponds to an estimated gestational age of five weeks  five days. However given the small size of the upper pole. No fetal heart rate  was accurately detected. A small fibroid is noted in the posterior uterine body  that measures only 1.4 cm. The right ovary measures 2.7 x 1.8 x 1.6 cm with  normal flow. The left ovary measures 4.2 x 3.0 x 3.0 cm with normal blood flow.  There is a 2.8 cm  simple cyst.    There is a minimal amount of simple free fluid in the cul-de-sac.      Impression    1. Intrauterine pregnancy with a normal-appearing yolk sac and a small fetal  pole with a crown-rump length that corresponds to an estimated gestational age  of five weeks five days. No fetal heart rate was clearly detected on this exam  however this is likely related to the early stage of pregnancy.  2. 2.8 cm left ovarian cyst.  3. Small 1.4 cm posterior uterine fibroid   Urinalysis w rflx microscopic   Result Value Ref Range    Color, UA YELLOW/STRAW      Appearance CLOUDY      Specific Gravity, UA 1.016 1.001 - 1.023      pH, Urine 7.0 5.0 - 9.0      Protein, UA Negative NEG mg/dL    Glucose, UA Negative mg/dL    Ketones, Urine Negative NEG mg/dL    Bilirubin Urine Negative NEG      Blood, Urine TRACE (A) NEG      Urobilinogen, Urine 0.2 0.2 - 1.0 EU/dL    Nitrite, Urine Negative NEG      Leukocyte Esterase, Urine SMALL (A) NEG     CBC with Auto Differential   Result Value Ref Range    WBC 5.4 4.3 - 11.1 K/uL    RBC 4.36 4.05 - 5.2 M/uL    Hemoglobin 11.8 11.7 - 15.4 g/dL    Hematocrit 16.1 09.6 - 46.3 %    MCV 86.2 82.0 - 102.0 FL    MCH 27.1 26.1 - 32.9 PG    MCHC 31.4 31.4 - 35.0 g/dL    RDW 04.5 40.9 - 81.1 %    Platelets 242 150 - 450 K/uL    MPV 10.2 9.4 - 12.3 FL    nRBC 0.00 0.0 - 0.2 K/uL    Differential Type AUTOMATED      Seg Neutrophils 47 43 - 78 %    Lymphocytes 39 13 - 44 %    Monocytes 9 4.0 - 12.0 %    Eosinophils % 4 0.5 - 7.8 %    Basophils 0 0.0 - 2.0 %    Immature Granulocytes 0 0.0 - 5.0 %    Segs Absolute 2.6 1.7 - 8.2 K/UL    Absolute Lymph # 2.1 0.5 - 4.6 K/UL    Absolute Mono # 0.5 0.1 - 1.3 K/UL    Absolute Eos # 0.2 0.0 - 0.8 K/UL    Basophils Absolute 0.0 0.0 - 0.2 K/UL    Absolute Immature Granulocyte 0.0 0.0 - 0.5 K/UL  BMP   Result Value Ref Range    Sodium 138 133 - 143 mmol/L    Potassium 3.7 3.5 - 5.1 mmol/L    Chloride 106 101 - 110 mmol/L    CO2 26 21 - 32 mmol/L    Anion Gap  6 2 - 11 mmol/L    Glucose 69 65 - 100 mg/dL    BUN 7 6 - 23 MG/DL    Creatinine 3.24 0.6 - 1.0 MG/DL    Est, Glom Filt Rate >60 >60 ml/min/1.30m2    Calcium 8.7 8.3 - 10.4 MG/DL   HCG, Quantitative, Pregnancy   Result Value Ref Range    hCG Quant 4,877 (H) 0.0 - 6.0 MIU/ML   Urinalysis, Micro   Result Value Ref Range    WBC, UA 5-10 0 /hpf    RBC, UA 0-3 0 /hpf    Epithelial Cells UA 5-10 0 /hpf    BACTERIA, URINE 2+ (H) 0 /hpf    Casts 0 0 /lpf    Crystals 0 0 /LPF    Mucus, UA 0 0 /lpf   POC Pregnancy Urine Qual   Result Value Ref Range    Preg Test, Ur Positive (A) NEG          US OB 1 OR MORE FETUS LIMITED   Final Result   1. Intrauterine pregnancy with a normal-appearing yolk sac and a small fetal   pole with a crown-rump length that corresponds to an estimated gestational age   of five weeks five days. No fetal heart rate was clearly detected on this exam   however this is likely related to the early stage of pregnancy.   2. 2.8 cm left ovarian cyst.   3. Small 1.4 cm posterior uterine fibroid                          Voice dictation software was used during the making of this note.  This software is not perfect and grammatical and other typographical errors may be present.  This note has not been completely proofread for errors.     Penelope Coop, MD  12/28/20 [phone]

## 2020-12-28 NOTE — Discharge Instructions (Addendum)
Please use ice lidocaine patches and Tylenol.  Return if you have any changes in urination or bowel habit, numbness or tingling in your lower extremities.  Today's pregnancy ultrasound was normal however if you did experience any abdominal pain, vaginal bleeding or any other concerning signs or symptoms return to the emergency department

## 2020-12-28 NOTE — ED Triage Notes (Signed)
Pt states after 2 hours of standing, she started to experience bilateral lower back pain. Pt states she found out she was pregnant with home test last week. Pt states she has intermittent nausea with some small cramps in the abdominal area.

## 2021-01-14 DIAGNOSIS — O211 Hyperemesis gravidarum with metabolic disturbance: Secondary | ICD-10-CM

## 2021-01-14 NOTE — ED Triage Notes (Signed)
Pt states she thinks she has reflux/ feels like something is stuck in her throat/ pt has not been able to keep things down/ pt c/o of fatigue and sleeps all the time

## 2021-01-14 NOTE — ED Provider Notes (Signed)
Emergency Department Provider Note                   PCP:                On File Not (Inactive)               Age: 31 y.o.      Sex: female       ICD-10-CM    1. Nausea and vomiting, unspecified vomiting type  R11.2       2. Dehydration  E86.0       3. [redacted] weeks gestation of pregnancy  Z3A.08           DISPOSITION         Medical Decision Making   Michelle Shaw is a 31 y.o. female who presents to the Emergency Department with chief complaint of dehydration, NV and fatigue in pregnancy.  Basic blood work and urine studies were ordered from triage upon arrival.  Per my initial review, patient has an elevated blood glucose at 143 with a history of prior gestational diabetes but never was on medication.  She does have a mildly low potassium at 3.2 which we will recommend replacing with dietary potassium.  Her hemoglobin is stable at 11.1, normal white count of 5.4.  Her urine shows moderate leukocytes with 20-50 white blood cells and 2+ bacteria.  We will send for culture and treat for UTI in the presence of pregnancy.  We will give her a dose of Keflex now.  We will additionally treat her symptomatically with IV Zofran, Pepcid and fluid bolus.  She has had a confirmed IUP so no further imaging is necessary today.  She has a nonsurgical abdomen and is not having any vaginal bleeding.  We will send her home with prescriptions for Keflex, Zofran and Pepcid.  Strict return precautions given.  Safe for discharge following fluid bolus.    Michelle Milany Geck, FNP-C, ENP-C  12:48 AM    Complexity of Problem: 2 or more self-limited or minor problems. (3)    I have conducted an independent ordering and review of Labs.  I have reviewed records from an external source: ED records from outside this hospital.  Considerations: Shared decision making was utilized in the care of this patient.           Orders Placed This Encounter   Procedures    CBC with Auto Differential    Basic Metabolic Panel    Urinalysis w rflx microscopic     POCT Urinalysis no Micro    POC Pregnancy Urine Qual    POC Pregnancy Urine Qual    Insert peripheral IV        Medications   0.9 % sodium chloride bolus (1,000 mLs IntraVENous New Bag 01/15/21 0042)   ondansetron (ZOFRAN) injection 4 mg (4 mg IntraVENous Given 01/15/21 0042)   famotidine (PEPCID) tablet 20 mg (20 mg Oral Given 01/15/21 0042)   cephALEXin (KEFLEX) capsule 500 mg (500 mg Oral Given 01/15/21 0042)       New Prescriptions    CEPHALEXIN (KEFLEX) 500 MG CAPSULE    Take 1 capsule by mouth 2 times daily for 7 days    FAMOTIDINE (PEPCID) 20 MG TABLET    Take 1 tablet by mouth 2 times daily as needed (reflux)    ONDANSETRON (ZOFRAN-ODT) 4 MG DISINTEGRATING TABLET    Take 2 tablets by mouth 3 times daily as needed for Nausea or Vomiting  Michelle Shaw is a 30 y.o. female who presents to the Emergency Department with chief complaint of dehydration, NV and fatigue in pregnancy.       Chief Complaint   Patient presents with    Emesis     X 2 weeks       HPI  Michelle Shaw is a G4 P3 female with history of asthma and gestational diabetes, presenting to the ER for nausea, vomiting, fatigue and reflux in the presence of pregnancy.  She is [redacted] weeks pregnant with a confirmed IUP.  She is not taking any nausea medications but does use occasional Tums to help with reflux symptoms.  Having 2 weeks of nausea symptoms with occasional vomiting with poor p.o. fluid intake.  She is not having any abdominal pain or vaginal bleeding but is having frequent urination but denies dysuria or hematuria.     All other systems reviewed and are negative unless otherwise stated in the history of present illness section.    Review of Systems   Constitutional:  Positive for appetite change (decreased). Negative for fever.   HENT: Negative.     Eyes: Negative.    Respiratory:  Negative for shortness of breath.    Cardiovascular:  Negative for chest pain.   Gastrointestinal:  Positive for nausea and vomiting. Negative for abdominal pain and  diarrhea.   Genitourinary:  Positive for frequency.   Musculoskeletal:  Negative for arthralgias and back pain.   Neurological:  Negative for headaches.   All other systems reviewed and are negative.    Past Medical History:   Diagnosis Date    Asthma         Past Surgical History:   Procedure Laterality Date    GYN      c/s        No family history on file.     Social History     Socioeconomic History    Marital status: Single   Tobacco Use    Smoking status: Former     Packs/day: 0.50     Types: Cigarettes     Quit date: 03/05/2020     Years since quitting: 0.8    Smokeless tobacco: Never   Substance and Sexual Activity    Alcohol use: Yes    Drug use: No        Allergies: Patient has no known allergies.    Previous Medications    ALBUTEROL SULFATE HFA 108 (90 BASE) MCG/ACT INHALER    Inhale 2 puffs into the lungs every 4 hours as needed    MELOXICAM (MOBIC) 15 MG TABLET    Take 1 tablet by mouth daily for 7 days        Vitals signs and nursing note reviewed.   Patient Vitals for the past 4 hrs:   Temp Pulse Resp BP SpO2   01/14/21 2300 98.2 ??F (36.8 ??C) 93 18 111/78 97 %          Physical Exam  Vitals and nursing note reviewed.   Constitutional:       Appearance: She is not ill-appearing.   HENT:      Mouth/Throat:      Mouth: Mucous membranes are moist.      Pharynx: Oropharynx is clear.   Cardiovascular:      Rate and Rhythm: Normal rate.   Pulmonary:      Effort: Pulmonary effort is normal.      Breath sounds: Normal breath sounds.  Comments: Respirations even and unlabored  Abdominal:      General: Abdomen is flat.      Palpations: Abdomen is soft.      Comments: Nonsurgical abdomen, no focal tenderness   Skin:     General: Skin is warm and dry.   Psychiatric:         Mood and Affect: Mood normal.        Procedures      Results for orders placed or performed during the hospital encounter of 01/14/21   CBC with Auto Differential   Result Value Ref Range    WBC 5.4 4.3 - 11.1 K/uL    RBC 4.17 4.05 - 5.2 M/uL     Hemoglobin 11.1 (L) 11.7 - 15.4 g/dL    Hematocrit 41.6 (L) 35.8 - 46.3 %    MCV 82.7 82.0 - 102.0 FL    MCH 26.6 26.1 - 32.9 PG    MCHC 32.2 31.4 - 35.0 g/dL    RDW 38.4 53.6 - 46.8 %    Platelets 227 150 - 450 K/uL    MPV 11.1 9.4 - 12.3 FL    nRBC 0.00 0.0 - 0.2 K/uL    Differential Type AUTOMATED      Seg Neutrophils 53 43 - 78 %    Lymphocytes 37 13 - 44 %    Monocytes 6 4.0 - 12.0 %    Eosinophils % 2 0.5 - 7.8 %    Basophils 0 0.0 - 2.0 %    Immature Granulocytes 1 0.0 - 5.0 %    Segs Absolute 2.9 1.7 - 8.2 K/UL    Absolute Lymph # 2.0 0.5 - 4.6 K/UL    Absolute Mono # 0.3 0.1 - 1.3 K/UL    Absolute Eos # 0.1 0.0 - 0.8 K/UL    Basophils Absolute 0.0 0.0 - 0.2 K/UL    Absolute Immature Granulocyte 0.0 0.0 - 0.5 K/UL   Basic Metabolic Panel   Result Value Ref Range    Sodium 137 133 - 143 mmol/L    Potassium 3.2 (L) 3.5 - 5.1 mmol/L    Chloride 104 101 - 110 mmol/L    CO2 22 21 - 32 mmol/L    Anion Gap 11 2 - 11 mmol/L    Glucose 143 (H) 65 - 100 mg/dL    BUN 8 6 - 23 MG/DL    Creatinine 0.32 0.6 - 1.0 MG/DL    Est, Glom Filt Rate >60 >60 ml/min/1.17m2    Calcium 8.9 8.3 - 10.4 MG/DL   Urinalysis w rflx microscopic   Result Value Ref Range    Color, UA YELLOW/STRAW      Appearance CLOUDY      Specific Gravity, UA 1.013 1.001 - 1.023      pH, Urine 6.5 5.0 - 9.0      Protein, UA Negative NEG mg/dL    Glucose, UA Negative mg/dL    Ketones, Urine Negative NEG mg/dL    Bilirubin Urine Negative NEG      Blood, Urine TRACE (A) NEG      Urobilinogen, Urine 1.0 0.2 - 1.0 EU/dL    Nitrite, Urine Negative NEG      Leukocyte Esterase, Urine MODERATE (A) NEG      WBC, UA 20-50 (A) U4 /hpf    RBC, UA 0-5 U5 /hpf    Epithelial Cells UA 5-10 (A) U5 /hpf    BACTERIA, URINE 2+ (A) NEG /hpf    Casts  0-2 U2 /lpf   POC Pregnancy Urine Qual   Result Value Ref Range    Preg Test, Ur Positive (A) NEG          No orders to display                         Voice dictation software was used during the making of this note.  This  software is not perfect and grammatical and other typographical errors may be present.  This note has not been completely proofread for errors.       Larey BrickBrittany J Deron Poole, APRN - NP  01/15/21 [phone]

## 2021-01-14 NOTE — ED Triage Notes (Signed)
Pt states 8 weeks preg

## 2021-01-15 ENCOUNTER — Inpatient Hospital Stay
Admit: 2021-01-15 | Discharge: 2021-01-15 | Disposition: A | Discharge status: Home / Self Care | Payer: MEDICAID | Attending: Emergency Medicine

## 2021-01-15 LAB — URINALYSIS
Bilirubin Urine: NEGATIVE
Glucose, UA: NEGATIVE mg/dL
Ketones, Urine: NEGATIVE mg/dL
Nitrite, Urine: NEGATIVE
Protein, UA: NEGATIVE mg/dL
Specific Gravity, UA: 1.013 (ref 1.001–1.023)
Urobilinogen, Urine: 1 EU/dL (ref 0.2–1.0)
pH, Urine: 6.5 (ref 5.0–9.0)

## 2021-01-15 LAB — CBC WITH AUTO DIFFERENTIAL
Absolute Eos #: 0.1 10*3/uL (ref 0.0–0.8)
Absolute Immature Granulocyte: 0 10*3/uL (ref 0.0–0.5)
Absolute Lymph #: 2 10*3/uL (ref 0.5–4.6)
Absolute Mono #: 0.3 10*3/uL (ref 0.1–1.3)
Basophils Absolute: 0 10*3/uL (ref 0.0–0.2)
Basophils: 0 % (ref 0.0–2.0)
Eosinophils %: 2 % (ref 0.5–7.8)
Hematocrit: 34.5 % — ABNORMAL LOW (ref 35.8–46.3)
Hemoglobin: 11.1 g/dL — ABNORMAL LOW (ref 11.7–15.4)
Immature Granulocytes: 1 % (ref 0.0–5.0)
Lymphocytes: 37 % (ref 13–44)
MCH: 26.6 PG (ref 26.1–32.9)
MCHC: 32.2 g/dL (ref 31.4–35.0)
MCV: 82.7 FL (ref 82.0–102.0)
MPV: 11.1 FL (ref 9.4–12.3)
Monocytes: 6 % (ref 4.0–12.0)
Platelets: 227 10*3/uL (ref 150–450)
RBC: 4.17 M/uL (ref 4.05–5.2)
RDW: 12.5 % (ref 11.9–14.6)
Seg Neutrophils: 53 % (ref 43–78)
Segs Absolute: 2.9 10*3/uL (ref 1.7–8.2)
WBC: 5.4 10*3/uL (ref 4.3–11.1)
nRBC: 0 10*3/uL (ref 0.0–0.2)

## 2021-01-15 LAB — BASIC METABOLIC PANEL
Anion Gap: 11 mmol/L (ref 2–11)
BUN: 8 MG/DL (ref 6–23)
CO2: 22 mmol/L (ref 21–32)
Calcium: 8.9 MG/DL (ref 8.3–10.4)
Chloride: 104 mmol/L (ref 101–110)
Creatinine: 0.82 MG/DL (ref 0.6–1.0)
Est, Glom Filt Rate: >60 mL/min/{1.73_m2} (ref >60)
Glucose: 143 mg/dL — ABNORMAL HIGH (ref 65–100)
Potassium: 3.2 mmol/L — ABNORMAL LOW (ref 3.5–5.1)
Sodium: 137 mmol/L (ref 133–143)

## 2021-01-15 LAB — POC PREGNANCY UR-QUAL: Preg Test, Ur: POSITIVE — AB

## 2021-01-15 MED ORDER — FAMOTIDINE 20 MG PO TABS
20 MG | ORAL_TABLET | Freq: Two times a day (BID) | ORAL | 0 refills | Status: AC | PRN
Start: 2021-01-15 — End: ?

## 2021-01-15 MED ORDER — ONDANSETRON HCL 4 MG/2ML IJ SOLN
4 MG/2ML | INTRAMUSCULAR | Status: AC
Start: 2021-01-15 — End: 2021-01-15
  Administered 2021-01-15: 06:00:00 4 mg via INTRAVENOUS

## 2021-01-15 MED ORDER — CEPHALEXIN 500 MG PO CAPS
500 MG | ORAL | Status: AC
Start: 2021-01-15 — End: 2021-01-15
  Administered 2021-01-15: 06:00:00 500 mg via ORAL

## 2021-01-15 MED ORDER — FAMOTIDINE 20 MG PO TABS
20 MG | ORAL | Status: AC
Start: 2021-01-15 — End: 2021-01-15
  Administered 2021-01-15: 06:00:00 20 mg via ORAL

## 2021-01-15 MED ORDER — ONDANSETRON 4 MG PO TBDP
4 MG | ORAL_TABLET | Freq: Three times a day (TID) | ORAL | 0 refills | Status: AC | PRN
Start: 2021-01-15 — End: ?

## 2021-01-15 MED ORDER — SODIUM CHLORIDE 0.9 % IV BOLUS
0.9 % | INTRAVENOUS | Status: AC
Start: 2021-01-15 — End: 2021-01-15
  Administered 2021-01-15: 06:00:00 1000 mL via INTRAVENOUS

## 2021-01-15 MED ORDER — CEPHALEXIN 500 MG PO CAPS
500 MG | ORAL_CAPSULE | Freq: Two times a day (BID) | ORAL | 0 refills | Status: AC
Start: 2021-01-15 — End: 2021-01-22

## 2021-01-15 MED FILL — ONDANSETRON HCL 4 MG/2ML IJ SOLN: 4 MG/2ML | INTRAMUSCULAR | Qty: 2

## 2021-01-15 MED FILL — CEPHALEXIN 500 MG PO CAPS: 500 MG | ORAL | Qty: 1

## 2021-01-15 MED FILL — FAMOTIDINE 20 MG PO TABS: 20 MG | ORAL | Qty: 1

## 2021-01-15 NOTE — Discharge Instructions (Addendum)
For nausea, take Zofran 1 tablet as needed every 8 hours.  For symptoms of reflux, take Pepcid 20 mg 1-2 times daily.  For treatment of UTI, take Keflex 1 tablet twice a day for 7 days.  Make sure take the entire course of antibiotics.  Follow-up with OB/GYN at your regularly scheduled OB/GYN appointment.  Make sure you drink lots of fluids.  It your blood work is reassuring today, however your blood sugar level is 143 and should be rechecked.  Return to the ED for new or worsening symptoms

## 2021-01-15 NOTE — ED Notes (Signed)
I have reviewed discharge instructions with the patient and spouse.  The patient and spouse verbalized understanding.    Patient left ED via Discharge Method: ambulatory to Home with family.    Opportunity for questions and clarification provided.       Patient given 3 scripts.         To continue your aftercare when you leave the hospital, you may receive an automated call from our care team to check in on how you are doing.  This is a free service and part of our promise to provide the best care and service to meet your aftercare needs.??? If you have questions, or wish to unsubscribe from this service please call [phone].  Thank you for Choosing our [facility] Emergency Department.        Monia Pouch, RN  01/15/21 [phone]

## 2021-01-17 LAB — CULTURE, URINE: Culture: 10000

## 2021-01-18 LAB — HEMOGLOBIN A1C
Estimated Avg Glucose, External: 108 mg/dL
Hemoglobin A1C, External: 5.4 % (ref <5.7)

## 2021-03-07 ENCOUNTER — Inpatient Hospital Stay: Admit: 2021-03-07 | Discharge: 2021-03-08 | Payer: MEDICAID | Attending: Emergency Medicine

## 2021-03-07 DIAGNOSIS — R519 Headache, unspecified: Secondary | ICD-10-CM

## 2021-03-07 LAB — URINALYSIS
Bilirubin Urine: NEGATIVE
Glucose, UA: NEGATIVE mg/dL
Ketones, Urine: NEGATIVE mg/dL
Nitrite, Urine: NEGATIVE
Protein, UA: NEGATIVE mg/dL
Specific Gravity, UA: 1.015 (ref 1.001–1.023)
Urobilinogen, Urine: 1 EU/dL (ref 0.2–1.0)
pH, Urine: 6.5 (ref 5.0–9.0)

## 2021-03-07 MED ORDER — CEPHALEXIN 500 MG PO CAPS
500 MG | ORAL | Status: AC
Start: 2021-03-07 — End: 2021-03-07
  Administered 2021-03-08: 500 mg via ORAL

## 2021-03-07 MED ORDER — DIPHENHYDRAMINE HCL 50 MG/ML IJ SOLN
50 MG/ML | INTRAMUSCULAR | Status: AC
Start: 2021-03-07 — End: 2021-03-07
  Administered 2021-03-08: 12.5 mg via INTRAVENOUS

## 2021-03-07 MED ORDER — SODIUM CHLORIDE 0.9 % IV BOLUS
0.9 % | INTRAVENOUS | Status: AC
Start: 2021-03-07 — End: 2021-03-07
  Administered 2021-03-08: 1000 mL via INTRAVENOUS

## 2021-03-07 MED ORDER — PROCHLORPERAZINE EDISYLATE 10 MG/2ML IJ SOLN
10 MG/2ML | INTRAMUSCULAR | Status: AC
Start: 2021-03-07 — End: 2021-03-07
  Administered 2021-03-08: 10 mg via INTRAVENOUS

## 2021-03-07 NOTE — ED Notes (Signed)
Pt left prior to discharge. I tried to contact her in order to DC her IV but could not contact her.     Carlis Stable, RN  03/07/21 2017

## 2021-03-07 NOTE — Discharge Instructions (Addendum)
Your labs look normal today.  Your urine is concerning for UTI.  This is possibly the cause of your symptoms.  Take antibiotic as prescribed.  Follow-up with your OB/GYN.  Return to the emergency department for any new, worsening, or concerning symptoms.

## 2021-03-07 NOTE — ED Notes (Signed)
PREGNANCY TEST RESULTS POSITIVE     [name]  03/07/21 [id number]

## 2021-03-07 NOTE — ED Triage Notes (Addendum)
Patient ambulatory tot triage. Patient c\\o abdominal pain that began three days ago. Patient c\\o some nausea. Patient states the pain is located at the lower part of her abdomen. Patient also c\\o headahce that has been occurring for a week.      Patient states she 15 weeks and 3 days pregnant.

## 2021-03-07 NOTE — ED Provider Notes (Signed)
Emergency Department Provider Note                   PCP:                On File Not (Inactive)               Age: 31 y.o.      Sex: female       ICD-10-CM    1. Acute nonintractable headache, unspecified headache type  R51.9       2. Acute UTI  N39.0           DISPOSITION Decision To Discharge 03/07/2021 07:57:42 PM        Medical Decision Making  31 year old female, G4P1, who presents emergency department today with complaint of generalized headache and abdominal cramping.  Patient appears in no acute distress today.  Denies vaginal bleeding or vaginal discharge.  Is seen by OB/GYN through [facility].  Denies any pregnancy issues today.  She is afebrile.  Will check labs and treat for headache.  We will also check urine.    Urine concerning for UTI.  Negative for ketones.  Labs unremarkable.  When I went to discuss results with the patient and reassess her pain, patient was not present.  She was seen walking out of the ER.  Charge nurse is going to try to call her to see if she will come back.  I have sent her prescription to the [facility] pharmacy she had on file to treat UTI.    Problems Addressed:  Acute nonintractable headache, unspecified headache type: acute illness or injury  Acute UTI: acute illness or injury    Amount and/or Complexity of Data Reviewed  Labs: ordered.           I have conducted an independent ordering and review of Labs.  I have reviewed records from an external source: provider visit notes from outside specialist.  Considerations: Shared decision making was utilized in the care of this patient.            ED Course as of 03/07/21 2006   Wed Mar 07, 2021   1959 Unable to locate patient.  I looked in all restrooms, the ER lobby, and outside.  All test results are back.  Unable to discuss treatment plan or results with the patient. [BC]      ED Course User Index  [BC] Stephannie Peters, APRN - CNP        Orders Placed This Encounter   Procedures    Culture, Urine    CBC with Diff    CMP    Lipase     Urinalysis w rflx microscopic    Diet NPO    POCT Urine Dipstick    POC Pregnancy Urine Qual    Saline lock IV        Medications   0.9 % sodium chloride bolus (1,000 mLs IntraVENous New Bag 03/07/21 1914)   prochlorperazine (COMPAZINE) injection 10 mg (10 mg IntraVENous Given 03/07/21 1905)   diphenhydrAMINE (BENADRYL) injection 12.5 mg (12.5 mg IntraVENous Given 03/07/21 1904)   cephALEXin (KEFLEX) capsule 500 mg (500 mg Oral Given 03/07/21 1905)       New Prescriptions    CEPHALEXIN (KEFLEX) 500 MG CAPSULE    Take 1 capsule by mouth 3 times daily for 7 days        Michelle Shaw is a 31 y.o. female who presents to the Emergency Department with chief complaint of  Chief Complaint   Patient presents with    Abdominal Pain      31 year old female, G29P1, who presents emergency department today with complaint of generalized headache and abdominal cramping.  Patient reports that she only has the abdominal cramping after taking Benadryl.  She has been taking Tylenol and Benadryl for the headache.  She has only been getting minimal relief out of these medications.  She denies any fever, chills, chest pain, shortness of breath, dysuria, hematuria, vaginal bleeding, or vaginal discharge.  She states she has only a headache at this time and no abdominal pain.    The history is provided by the patient.       Review of Systems   Constitutional:  Negative for fever.   Eyes:  Negative for visual disturbance.   Respiratory:  Negative for shortness of breath.    Cardiovascular:  Negative for chest pain.   Gastrointestinal:  Positive for abdominal pain.   Genitourinary:  Negative for dysuria.   Neurological:  Positive for headaches. Negative for dizziness.   All other systems reviewed and are negative.    Past Medical History:   Diagnosis Date    Asthma         Past Surgical History:   Procedure Laterality Date    GYN      c/s        No family history on file.     Social History     Socioeconomic History    Marital status: Single    Tobacco Use    Smoking status: Former     Packs/day: 0.50     Types: Cigarettes     Quit date: 03/05/2020     Years since quitting: 1.0    Smokeless tobacco: Never   Substance and Sexual Activity    Alcohol use: Yes    Drug use: No         Patient has no known allergies.     Previous Medications    ALBUTEROL SULFATE HFA 108 (90 BASE) MCG/ACT INHALER    Inhale 2 puffs into the lungs every 4 hours as needed    FAMOTIDINE (PEPCID) 20 MG TABLET    Take 1 tablet by mouth 2 times daily as needed (reflux)    MELOXICAM (MOBIC) 15 MG TABLET    Take 1 tablet by mouth daily for 7 days    ONDANSETRON (ZOFRAN-ODT) 4 MG DISINTEGRATING TABLET    Take 2 tablets by mouth 3 times daily as needed for Nausea or Vomiting        Vitals signs and nursing note reviewed.   Patient Vitals for the past 4 hrs:   Temp Pulse Resp BP SpO2   03/07/21 1818 99.1 ??F (37.3 ??C) 75 18 108/71 100 %          Physical Exam  Vitals and nursing note reviewed.   Constitutional:       General: She is not in acute distress.     Appearance: Normal appearance. She is well-developed. She is obese. She is not ill-appearing, toxic-appearing or diaphoretic.   HENT:      Head: Normocephalic and atraumatic.      Mouth/Throat:      Mouth: Mucous membranes are moist.   Eyes:      General: No scleral icterus.     Extraocular Movements: Extraocular movements intact.      Pupils: Pupils are equal, round, and reactive to light.   Cardiovascular:      Rate and  Rhythm: Normal rate.      Heart sounds: Normal heart sounds.   Pulmonary:      Effort: Pulmonary effort is normal. No respiratory distress.      Breath sounds: Normal breath sounds.   Abdominal:      General: Abdomen is flat. Bowel sounds are normal. There is no distension.      Tenderness: There is no abdominal tenderness.   Skin:     General: Skin is warm and dry.      Capillary Refill: Capillary refill takes less than 2 seconds.   Neurological:      General: No focal deficit present.      Mental Status: She is alert  and oriented to person, place, and time.      GCS: GCS eye subscore is 4. GCS verbal subscore is 5. GCS motor subscore is 6.      Motor: Motor function is intact.      Coordination: Coordination is intact.      Gait: Gait is intact.      Comments: Patient appears with normal attention, orientation, concentration, and language.  Memory appears normal.  Cranial nerves intact with normal visual fields, extraocular movements.  Hearing appears normal.  Patient is ambulatory with normal, steady gait.     Psychiatric:         Mood and Affect: Mood normal.         Behavior: Behavior normal.        Procedures    Results for orders placed or performed during the hospital encounter of 03/07/21   CBC with Diff   Result Value Ref Range    WBC 6.2 4.3 - 11.1 K/uL    RBC 3.91 (L) 4.05 - 5.2 M/uL    Hemoglobin 10.7 (L) 11.7 - 15.4 g/dL    Hematocrit 32.6 (L) 35.8 - 46.3 %    MCV 83.4 82.0 - 102.0 FL    MCH 27.4 26.1 - 32.9 PG    MCHC 32.8 31.4 - 35.0 g/dL    RDW 12.6 11.9 - 14.6 %    Platelets 194 150 - 450 K/uL    MPV 9.9 9.4 - 12.3 FL    nRBC 0.00 0.0 - 0.2 K/uL    Differential Type AUTOMATED      Seg Neutrophils 60 43 - 78 %    Lymphocytes 28 13 - 44 %    Monocytes 7 4.0 - 12.0 %    Eosinophils % 4 0.5 - 7.8 %    Basophils 0 0.0 - 2.0 %    Immature Granulocytes 0 0.0 - 5.0 %    Segs Absolute 3.7 1.7 - 8.2 K/UL    Absolute Lymph # 1.8 0.5 - 4.6 K/UL    Absolute Mono # 0.5 0.1 - 1.3 K/UL    Absolute Eos # 0.2 0.0 - 0.8 K/UL    Basophils Absolute 0.0 0.0 - 0.2 K/UL    Absolute Immature Granulocyte 0.0 0.0 - 0.5 K/UL   CMP   Result Value Ref Range    Sodium 137 133 - 143 mmol/L    Potassium 3.3 (L) 3.5 - 5.1 mmol/L    Chloride 105 101 - 110 mmol/L    CO2 24 21 - 32 mmol/L    Anion Gap 8 2 - 11 mmol/L    Glucose 121 (H) 65 - 100 mg/dL    BUN 8 6 - 23 MG/DL    Creatinine 0.65 0.6 - 1.0 MG/DL    Est,  Glom Filt Rate >60 >60 ml/min/1.7m    Calcium 8.7 8.3 - 10.4 MG/DL    Total Bilirubin <0.1 (L) 0.2 - 1.1 MG/DL    ALT 24 12 - 65 U/L     AST 11 (L) 15 - 37 U/L    Alk Phosphatase 56 50 - 130 U/L    Total Protein 7.2 6.3 - 8.2 g/dL    Albumin 2.8 (L) 3.5 - 5.0 g/dL    Globulin 4.4 2.8 - 4.5 g/dL    Albumin/Globulin Ratio 0.6 0.4 - 1.6     Lipase   Result Value Ref Range    Lipase 80 73 - 393 U/L   Urinalysis w rflx microscopic   Result Value Ref Range    Color, UA YELLOW/STRAW      Appearance CLEAR      Specific Gravity, UA 1.015 1.001 - 1.023      pH, Urine 6.5 5.0 - 9.0      Protein, UA Negative NEG mg/dL    Glucose, UA Negative mg/dL    Ketones, Urine Negative NEG mg/dL    Bilirubin Urine Negative NEG      Blood, Urine TRACE (A) NEG      Urobilinogen, Urine 1.0 0.2 - 1.0 EU/dL    Nitrite, Urine Negative NEG      Leukocyte Esterase, Urine MODERATE (A) NEG      WBC, UA 20-50 (A) U4 /hpf    RBC, UA 0-5 U5 /hpf    Epithelial Cells UA 5-10 (A) U5 /hpf    BACTERIA, URINE 2+ (A) NEG /hpf    Casts 0-2 U2 /lpf   POC Pregnancy Urine Qual   Result Value Ref Range    HCG, Urine, POC Positive Negative    Lot Number [id number]    Positive QC Pass/Fail Pass     Negative QC Pass/Fail Pass         No orders to display                       Voice dictation software was used during the making of this note.  This software is not perfect and grammatical and other typographical errors may be present.  This note has not been completely proofread for errors.       BStephannie Peters APRN - [facility] [place] 03/07/21 2006

## 2021-03-07 NOTE — ED Notes (Signed)
Unable to find patient. Charge nurse will call her.     Carlis Stable, RN  03/07/21 [phone]

## 2021-03-08 LAB — POC PREGNANCY UR-QUAL: HCG, Urine, POC: POSITIVE

## 2021-03-08 LAB — CBC WITH AUTO DIFFERENTIAL
Absolute Immature Granulocyte: 0 10*3/uL (ref 0.0–0.5)
Basophils %: 0 % (ref 0.0–2.0)
Basophils Absolute: 0 10*3/uL (ref 0.0–0.2)
Eosinophils %: 4 % (ref 0.5–7.8)
Eosinophils Absolute: 0.2 10*3/uL (ref 0.0–0.8)
Hematocrit: 32.6 % — ABNORMAL LOW (ref 35.8–46.3)
Hemoglobin: 10.7 g/dL — ABNORMAL LOW (ref 11.7–15.4)
Immature Granulocytes: 0 % (ref 0.0–5.0)
Lymphocytes %: 28 % (ref 13–44)
Lymphocytes Absolute: 1.8 10*3/uL (ref 0.5–4.6)
MCH: 27.4 PG (ref 26.1–32.9)
MCHC: 32.8 g/dL (ref 31.4–35.0)
MCV: 83.4 FL (ref 82.0–102.0)
MPV: 9.9 FL (ref 9.4–12.3)
Monocytes %: 7 % (ref 4.0–12.0)
Monocytes Absolute: 0.5 10*3/uL (ref 0.1–1.3)
Neutrophils %: 60 % (ref 43–78)
Neutrophils Absolute: 3.7 10*3/uL (ref 1.7–8.2)
Platelets: 194 10*3/uL (ref 150–450)
RBC: 3.91 M/uL — ABNORMAL LOW (ref 4.05–5.2)
RDW: 12.6 % (ref 11.9–14.6)
WBC: 6.2 10*3/uL (ref 4.3–11.1)
nRBC: 0 10*3/uL (ref 0.0–0.2)

## 2021-03-08 LAB — COMPREHENSIVE METABOLIC PANEL
ALT: 24 U/L (ref 12–65)
AST: 11 U/L — ABNORMAL LOW (ref 15–37)
Albumin/Globulin Ratio: 0.6 (ref 0.4–1.6)
Albumin: 2.8 g/dL — ABNORMAL LOW (ref 3.5–5.0)
Alk Phosphatase: 56 U/L (ref 50–130)
Anion Gap: 8 mmol/L (ref 2–11)
BUN: 8 MG/DL (ref 6–23)
CO2: 24 mmol/L (ref 21–32)
Calcium: 8.7 MG/DL (ref 8.3–10.4)
Chloride: 105 mmol/L (ref 101–110)
Creatinine: 0.65 MG/DL (ref 0.6–1.0)
Est, Glom Filt Rate: >60 mL/min/{1.73_m2} (ref >60)
Globulin: 4.4 g/dL (ref 2.8–4.5)
Glucose: 121 mg/dL — ABNORMAL HIGH (ref 65–100)
Potassium: 3.3 mmol/L — ABNORMAL LOW (ref 3.5–5.1)
Sodium: 137 mmol/L (ref 133–143)
Total Bilirubin: <0.1 MG/DL — ABNORMAL LOW (ref 0.2–1.1)
Total Protein: 7.2 g/dL (ref 6.3–8.2)

## 2021-03-08 LAB — LIPASE: Lipase: 80 U/L (ref 73–393)

## 2021-03-08 MED ORDER — CEPHALEXIN 500 MG PO CAPS
500 MG | ORAL_CAPSULE | Freq: Three times a day (TID) | ORAL | 0 refills | Status: AC
Start: 2021-03-08 — End: 2021-03-14

## 2021-03-08 MED FILL — CEPHALEXIN 500 MG PO CAPS: 500 MG | ORAL | Qty: 1

## 2021-03-08 MED FILL — DIPHENHYDRAMINE HCL 50 MG/ML IJ SOLN: 50 MG/ML | INTRAMUSCULAR | Qty: 1

## 2021-03-08 MED FILL — PROCHLORPERAZINE EDISYLATE 10 MG/2ML IJ SOLN: 10 MG/2ML | INTRAMUSCULAR | Qty: 2

## 2021-03-10 LAB — CULTURE, URINE: Culture: >100000

## 2021-06-25 LAB — EXTERNAL UNMAPPED PROCEDURES: Hemoglobin A1C, External: 5.3 % (ref <5.7)

## 2021-11-29 ENCOUNTER — Inpatient Hospital Stay: Admit: 2021-11-29 | Discharge: 2021-11-29 | Disposition: A | Discharge status: Home / Self Care | Payer: MEDICAID

## 2021-11-29 DIAGNOSIS — M62838 Other muscle spasm: Secondary | ICD-10-CM

## 2021-11-29 MED ORDER — METHOCARBAMOL 750 MG PO TABS
750 MG | ORAL | Status: AC
Start: 2021-11-29 — End: 2021-11-29
  Administered 2021-11-29: 19:00:00 1500 mg via ORAL

## 2021-11-29 MED ORDER — METHOCARBAMOL 750 MG PO TABS
750 MG | ORAL_TABLET | Freq: Three times a day (TID) | ORAL | 0 refills | Status: AC
Start: 2021-11-29 — End: 2021-12-06

## 2021-11-29 MED ORDER — KETOROLAC TROMETHAMINE 30 MG/ML IJ SOLN
30 MG/ML | INTRAMUSCULAR | Status: AC
Start: 2021-11-29 — End: 2021-11-29
  Administered 2021-11-29: 19:00:00 30 mg via INTRAMUSCULAR

## 2021-11-29 MED ORDER — LORAZEPAM 1 MG PO TABS
1 MG | Freq: Once | ORAL | Status: AC
Start: 2021-11-29 — End: 2021-11-29
  Administered 2021-11-29: 19:00:00 1 mg via ORAL

## 2021-11-29 MED FILL — LORAZEPAM 1 MG PO TABS: 1 MG | ORAL | Qty: 1

## 2021-11-29 MED FILL — METHOCARBAMOL 750 MG PO TABS: 750 MG | ORAL | Qty: 2

## 2021-11-29 MED FILL — KETOROLAC TROMETHAMINE 30 MG/ML IJ SOLN: 30 MG/ML | INTRAMUSCULAR | Qty: 1

## 2021-11-29 NOTE — ED Triage Notes (Signed)
Patient ambulatory to triage. Patient c\o neck pain that began last night.

## 2021-11-29 NOTE — ED Notes (Signed)
Pt reports pain 10/10 after mediations. Reports husband is picking her up. PA made aware      Darlyne Russian, RN  11/29/21 [phone]

## 2021-11-29 NOTE — ED Provider Notes (Signed)
Emergency Department Provider Note       PCP: Not, On File (Inactive)   Age: 31 y.o.   Sex: female     DISPOSITION Decision To Discharge 11/29/2021 02:43:40 PM       ICD-10-CM    1. Trapezius muscle spasm  M62.838           Medical Decision Making     Complexity of Problems Addressed:  1 or more acute illnesses that pose a threat to life or bodily function.     Data Reviewed and Analyzed:   I independently ordered and reviewed each unique test.    Discussion of management or test interpretation.  This patient is a 31 year old female with a history of tubal ligation who presents today due to bilateral cervical paraspinal muscle tightness that started on the left side when she woke up yesterday morning.  She has no meningeal signs and normal flexion and extension at the neck without pain or difficulty.  She has moderately decreased lateral range of motion with turning her head left and right.  Patient has significant tenderness to her trapezius muscles bilaterally extending from the base of her skull down her neck into her shoulders.  She has no midline spinal tenderness, no photophobia, no fevers or chills, no trauma to cause this pain.  I treated the patient here today with Robaxin, Toradol, and Ativan.  On reevaluation, patient achieved moderate improvement of her symptoms.  I will give the patient a prescription for Robaxin and we discussed conservative care measures.  She verbalized understanding agreement the plan.    Risk of Complications and/or Morbidity of Patient Management:  Prescription drug management performed.  Shared medical decision making was utilized in creating the patients health plan today.    ED Course as of 11/29/21 1444   Thu Nov 29, 2021   1401 On reevaluation after Robaxin and Toradol, patient states that her neck pain is still present.  We will trial Ativan at this time. [KS]   1442 After the Ativan, patient is achieving moderate improvement and is now able to near normal range of motion  with rotation of the head to the right.  It is still slightly worse on the left side.  I advised the patient that it will take a little more time for it to fully resolve however we will prescribe Robaxin for her to take at home. [KS]      ED Course User Index  [KS] Yetta Flock, PA       Is this patient to be included in the SEP-1 core measure due to severe sepsis or septic shock? No Exclusion criteria - the patient is NOT to be included for SEP-1 Core Measure due to: Infection is not suspected      History      This patient is a 31 year old female with a history of tubal ligation who presents today due to bilateral cervical paraspinal muscle tightness that started on the left side when she woke up yesterday morning.  She states that it started out as feeling like a "crick in my neck".  She has tried taking Flexeril several times yesterday and it did not seem to help.  She has not taken any medicine today.  When she woke up this morning, the right side of her neck started to hurt.  Patient states that she has a slight headache at the base of her skull where her muscles in her neck began.  She denies fevers, chills, photophobia, photophobia,  or any other symptom.  She has had no previous surgery, injury, or trauma to her neck or back.    The history is provided by the patient. No language interpreter was used.        Review of Systems   Eyes:  Negative for photophobia.   Musculoskeletal:  Positive for neck pain and neck stiffness. Negative for back pain.   Neurological:  Positive for headaches. Negative for dizziness.   All other systems reviewed and are negative.      Physical Exam     Vitals signs and nursing note reviewed:  Vitals:    11/29/21 1317   BP: (!) 115/91   Pulse: 91   Resp: 18   Temp: 99 F (37.2 C)   TempSrc: Oral   SpO2: 100%   Weight: 113.4 kg (250 lb)   Height: 1.727 m (5\' 8" )      Physical Exam  Vitals and nursing note reviewed.   Constitutional:       General: She is not in acute distress.      Appearance: Normal appearance. She is obese. She is not ill-appearing, toxic-appearing or diaphoretic.   HENT:      Head: Normocephalic and atraumatic.      Nose: Nose normal.      Mouth/Throat:      Mouth: Mucous membranes are moist.   Eyes:      General: No scleral icterus.        Right eye: No discharge.         Left eye: No discharge.      Extraocular Movements: Extraocular movements intact.      Conjunctiva/sclera: Conjunctivae normal.      Pupils: Pupils are equal, round, and reactive to light.      Comments: No photophobia.   Neck:      Comments: Normal flexion and extension at the neck.  No meningeal signs.  Moderately decreased lateral motion with turning head left and right.  Significant tenderness to palpation of her trapezius muscles bilaterally.  No midline spinal tenderness.  Cardiovascular:      Rate and Rhythm: Normal rate and regular rhythm.      Pulses: Normal pulses.      Heart sounds: Normal heart sounds. No murmur heard.     No friction rub. No gallop.   Pulmonary:      Effort: Pulmonary effort is normal. No respiratory distress.      Breath sounds: Normal breath sounds. No wheezing or rales.   Abdominal:      General: Abdomen is flat.      Palpations: Abdomen is soft.   Musculoskeletal:         General: Normal range of motion.      Cervical back: Neck supple.   Skin:     General: Skin is warm and dry.      Capillary Refill: Capillary refill takes less than 2 seconds.   Neurological:      General: No focal deficit present.      Mental Status: She is alert and oriented to person, place, and time. Mental status is at baseline.   Psychiatric:         Mood and Affect: Mood normal.         Behavior: Behavior normal.         Thought Content: Thought content normal.         Judgment: Judgment normal.          Procedures  Procedures    No orders of the defined types were placed in this encounter.       Medications given during this emergency department visit:  Medications   ketorolac (TORADOL)  injection 30 mg (30 mg IntraMUSCular Given 11/29/21 1340)   methocarbamol (ROBAXIN) tablet 1,500 mg (1,500 mg Oral Given 11/29/21 1340)   LORazepam (ATIVAN) tablet 1 mg (1 mg Oral Given 11/29/21 1414)       New Prescriptions    METHOCARBAMOL (ROBAXIN-750) 750 MG TABLET    Take 1 tablet by mouth 3 times daily for 7 days        Past Medical History:   Diagnosis Date    Asthma         Past Surgical History:   Procedure Laterality Date    GYN      c/s        Social History     Socioeconomic History    Marital status: Single   Tobacco Use    Smoking status: Former     Packs/day: .5     Types: Cigarettes     Quit date: 03/05/2020     Years since quitting: 1.7    Smokeless tobacco: Never   Substance and Sexual Activity    Alcohol use: Yes    Drug use: No        Previous Medications    ALBUTEROL SULFATE HFA 108 (90 BASE) MCG/ACT INHALER    Inhale 2 puffs into the lungs every 4 hours as needed    FAMOTIDINE (PEPCID) 20 MG TABLET    Take 1 tablet by mouth 2 times daily as needed (reflux)    MELOXICAM (MOBIC) 15 MG TABLET    Take 1 tablet by mouth daily for 7 days    ONDANSETRON (ZOFRAN-ODT) 4 MG DISINTEGRATING TABLET    Take 2 tablets by mouth 3 times daily as needed for Nausea or Vomiting        No results found for any visits on 11/29/21.     No orders to display                     Voice dictation software was used during the making of this note.  This software is not perfect and grammatical and other typographical errors may be present.  This note has not been completely proofread for errors.        Yetta Flock, [place]  11/29/21 1444

## 2021-11-29 NOTE — Discharge Instructions (Signed)
Continuity of Care Form    Patient Name: Michelle Shaw   DOB:  Apr 11, 1990  MRN:  [id number]    Admit date:  11/29/2021  Discharge date:  ***    Code Status Order: No Order   Advance Directives:     Admitting Physician:  No admitting provider for patient encounter.  PCP: Not, On File (Inactive)    Discharging Nurse: [facility] Unit/Room#: RPA3/RP3  Discharging Unit Phone Number: ***    Emergency Contact:   Extended Emergency Contact Information  Primary Emergency Contact: Luisa Hart [place] Phone: [phone]  Mobile Phone: [phone]  Relation: Other    Past Surgical History:  Past Surgical History:   Procedure Laterality Date    GYN      c/s       Immunization History:     There is no immunization history on file for this patient.    Active Problems:  There is no problem list on file for this patient.      Isolation/Infection:   Isolation            No Isolation          Patient Infection Status       Infection Onset Added Last Indicated Last Indicated By Review Planned Expiration Resolved Resolved By    None active    Resolved    COVID-19 10/31/20 10/31/20 10/31/20 COVID-19, Rapid   11/14/20 Infection Expired    COVID-19 02/18/20 02/18/20 02/18/20 Conversion, Epic   03/03/20 Conversion, Epic                       Nurse Assessment:  Last Vital Signs: BP 117/76   Pulse 78   Temp 99 F (37.2 C) (Oral)   Resp 17   Ht 1.727 m (5\' 8" )   Wt 113.4 kg (250 lb)   SpO2 100%   BMI 38.01 kg/m     Last documented pain score (0-10 scale): Pain Level: 7  Last Weight:   Wt Readings from Last 1 Encounters:   11/29/21 113.4 kg (250 lb)     Mental Status:  {IP PT MENTAL STATUS:20030}    IV Access:  {MH COC IV ACCESS:304088262}    Nursing Mobility/ADLs:  Walking   {CHP DME YQIH:474259563}  Transfer  {CHP DME OVFI:433295188}  Bathing  {CHP DME CZYS:063016010}  Dressing  {CHP DME XNAT:557322025}  Toileting  {CHP DME KYHC:623762831}  Feeding  {CHP DME DVVO:160737106}  Med Admin   {CHP DME YIRS:854627035}  Med Delivery   {MH COC MED Delivery:304088264}    Wound Care Documentation and Therapy:        Elimination:  Continence:   Bowel: {YES / KK:93818}  Bladder: {YES / EX:93716}  Urinary Catheter: {Urinary Catheter:304088013}   Colostomy/Ileostomy/Ileal Conduit: {YES / RC:78938}       Date of Last BM: ***  No intake or output data in the 24 hours ending 11/29/21 1448  No intake/output data recorded.    Safety Concerns:     [place] COC Safety Concerns:304088272}    Impairments/Disabilities:      [place] COC Impairments/Disabilities:304088273}    Nutrition Therapy:  Current Nutrition Therapy:   [place] COC Diet List:304088271}    Routes of Feeding: {CHP DME Other Feedings:304088042}  Liquids: {Slp liquid thickness:30034}  Daily Fluid Restriction: {CHP DME Yes amt example:304088041}  Last Modified Barium Swallow with Video (Video Swallowing Test): {Done Not Done BOFB:510258527}    Treatments at  the Time of Hospital Discharge:   Respiratory Treatments: ***  Oxygen Therapy:  {Therapy; copd oxygen:17808}  Ventilator:    {MH CC Vent MLYY:503546568}    Rehab Therapies: {THERAPEUTIC INTERVENTION:[phone]}  Weight Bearing Status/Restrictions: {MH CC Weight Bearing:304508812}  Other Medical Equipment (for information only, NOT a DME order):  {EQUIPMENT:304520077}  Other Treatments: ***    Patient's personal belongings (please select all that are sent with patient):  {CHP DME Belongings:304088044}    RN SIGNATURE:  {Esignature:304088025}    CASE MANAGEMENT/SOCIAL WORK SECTION    Inpatient Status Date: ***    Readmission Risk Assessment Score:  Readmission Risk              Risk of Unplanned Readmission:  0           Discharging to Facility/ Agency   Name:   Address:  Phone:  Fax:    Dialysis Facility (if applicable)   Name:  Address:  Dialysis Schedule:  Phone:  Fax:    Case Manager/Social Worker signature: {Esignature:304088025}    PHYSICIAN SECTION    Prognosis: {Prognosis:628-008-0329}    Condition at Discharge:  {MH Patient Condition:304088024}    Rehab Potential (if transferring to Rehab): {Prognosis:628-008-0329}    Recommended Labs or Other Treatments After Discharge: ***    Physician Certification: I certify the above information and transfer of Michelle Shaw  is necessary for the continuing treatment of the diagnosis listed and that she requires {Admit to Appropriate Level of Care:20763} for {GREATER/LESS:304500278} 30 days.     Update Admission H&P: {CHP DME Changes in LEXNT:700174944}    PHYSICIAN SIGNATURE:  {Esignature:304088025}

## 2021-11-29 NOTE — ED Notes (Signed)
I have reviewed discharge instructions with the patient.  The patient verbalized understanding.    Patient left ED via Discharge Method: ambulatory to Home with family.    Opportunity for questions and clarification provided.       Patient given 1 scripts.         To continue your aftercare when you leave the hospital, you may receive an automated call from our care team to check in on how you are doing.  This is a free service and part of our promise to provide the best care and service to meet your aftercare needs." If you have questions, or wish to unsubscribe from this service please call [phone].  Thank you for Choosing our [facility] Emergency Department.        Darlyne Russian, RN  11/29/21 [phone]

## 2021-11-29 NOTE — Discharge Instructions (Signed)
Please use a heating pad to your neck to help relax her muscle.  Do gentle stretching and range of motion exercises in the shower in order to loosen up your muscles.  You may continue taking the medication as prescribed but remember it may make you drowsy so do not drive or operate machinery while taking it.  Take Tylenol and ibuprofen as needed to help with pain.  Remember that this may take a little bit of time for the muscle to fully relax.  If your symptoms change or worsen, return immediately to the emergency department.    We would love to help you get a primary care doctor for follow-up after your emergency department visit.    Please call [phone] between 7AM - 6PM Monday to Friday.  A care navigator will be able to assist you with setting up a doctor close to your home.

## 2022-03-30 DIAGNOSIS — R519 Headache, unspecified: Secondary | ICD-10-CM

## 2022-03-30 NOTE — ED Provider Notes (Incomplete)
Emergency Department Provider Note       PCP: Not, On File (Inactive)   Age: 32 y.o.   Sex: female     DISPOSITION       No diagnosis found.    Medical Decision Making     ***     {Complexity:68354}  NE:[id number]    I independently ordered and reviewed each unique test.  {external source:53765}   {Historian (state who, why needed, what they said):56592}  {test reviewed:53725}  {EKG:67623}  {Admitted or Consultants involved.:58337}  {MIPS URI - Strep - Sinusitis - Pregnant - Head Trauma - Overdose - Agitation:67386}  {SEP1 yes/no:68358}  {Critical DC:5371187    History     32 year old female presenting to the emerged part with concerns of daily headaches over the last 2 weeks.  Reports that the headaches last throughout the day, they are left-sided and throbbing in nature with associated nausea and light sensitivity and sound sensitivity.  Headaches are improved with use of ibuprofen but the headaches continually return each morning.  Does complain of blurry vision in both eyes.  She has never worn glasses feels like she may need to.  No recent injuries or mechanism of injury that could be contributing to her headaches.    The history is provided by the patient.     Physical Exam     Vitals signs and nursing note reviewed:  Vitals:    03/30/22 2257   BP: 108/65   Pulse: 93   Resp: 18   Temp: 98.6 F (37 C)   TempSrc: Oral   SpO2: 100%      Physical Exam  Vitals reviewed.   Constitutional:       General: She is not in acute distress.     Appearance: Normal appearance. She is normal weight. She is not ill-appearing.   HENT:      Head: Normocephalic and atraumatic.   Eyes:      Extraocular Movements: Extraocular movements intact.      Pupils: Pupils are equal, round, and reactive to light.   Cardiovascular:      Rate and Rhythm: Normal rate.   Pulmonary:      Effort: Pulmonary effort is normal. No respiratory distress.   Musculoskeletal:         General: Normal range of motion.      Cervical back: Normal range of  motion. No rigidity or tenderness.   Neurological:      Mental Status: She is alert.        Procedures     Procedures    No orders of the defined types were placed in this encounter.       Medications given during this emergency department visit:  Medications   ketorolac (TORADOL) injection 30 mg (has no administration in time range)   magnesium oxide (MAG-OX) tablet 400 mg (has no administration in time range)   metoclopramide (REGLAN) tablet 10 mg (has no administration in time range)       New Prescriptions    No medications on file        Past Medical History:   Diagnosis Date   . Asthma         Past Surgical History:   Procedure Laterality Date   . GYN      c/s        Social History     Socioeconomic History   . Marital status: Single   Tobacco Use   . Smoking status: Former  Current packs/day: 0.00     Types: Cigarettes     Quit date: 03/05/2020     Years since quitting: 2.0   . Smokeless tobacco: Never   Substance and Sexual Activity   . Alcohol use: Yes   . Drug use: No        Previous Medications    ALBUTEROL SULFATE HFA 108 (90 BASE) MCG/ACT INHALER    Inhale 2 puffs into the lungs every 4 hours as needed    FAMOTIDINE (PEPCID) 20 MG TABLET    Take 1 tablet by mouth 2 times daily as needed (reflux)    MELOXICAM (MOBIC) 15 MG TABLET    Take 1 tablet by mouth daily for 7 days    ONDANSETRON (ZOFRAN-ODT) 4 MG DISINTEGRATING TABLET    Take 2 tablets by mouth 3 times daily as needed for Nausea or Vomiting        No results found for any visits on 03/30/22.      No orders to display                No results for input(s): "COVID19" in the last 72 hours.     Voice dictation software was used during the making of this note.  This software is not perfect and grammatical and other typographical errors may be present.  This note has not been completely proofread for errors.

## 2022-03-30 NOTE — ED Provider Notes (Signed)
Emergency Department Provider Note       PCP: Not, On File (Inactive)   Age: 32 y.o.   Sex: female     [place]    1. Nonintractable episodic headache, unspecified headache type  R51.9           Medical Decision Making     Patient presenting with 2-week history of daily episodic left-sided headaches.  Associated light and sound sensitivity.  Headaches are improving with ibuprofen but returning every day.  Complaining that she is having some blurry vision in both of her eyes.  Has never worn glasses.  Has not had an eye exam since she was in grade school.  Will perform visual acuity.  20/50 bilaterally OS OD.  Given migraine headache cocktail and reassess for improvement.  Headaches are improving after administration of migraine cocktail.  Considering she is having visual changes and daily episodic headaches suspicion that her headaches may be secondary to visual deficit and may require eyeglasses.  She will need to follow-up with optometrist to evaluate for need for eyeglasses.  Suspicion that her daily headaches are secondary to lack of appropriate visual assistance leading to eyestrain.  Advised continuation of conservative symptomatic management at home.  In the absence of dangerous mechanism of injury including absence of falls, trauma I do not believe CT imaging of the head is warranted.  No abnormal physical examination findings are demonstrated.     1 acute, uncomplicated illness or injury.  Over the counter drug management performed.  Patient was discharged risks and benefits of hospitalization were considered.  Shared medical decision making was utilized in creating the patients health plan today.    I independently ordered and reviewed each unique test.                     History     32 year old female presenting to the emerged part with concerns of daily headaches over the last 2 weeks.  Reports that the headaches last throughout the day, they are left-sided and throbbing in  nature with associated nausea and light sensitivity and sound sensitivity.  Headaches are improved with use of ibuprofen but the headaches continually return each morning.  Does complain of blurry vision in both eyes.  She has never worn glasses feels like she may need to.  No recent injuries or mechanism of injury that could be contributing to her headaches.    The history is provided by the patient.     Physical Exam     Vitals signs and nursing note reviewed:  Vitals:    03/30/22 2257   BP: 108/65   Pulse: 93   Resp: 18   Temp: 98.6 F (37 C)   TempSrc: Oral   SpO2: 100%      Physical Exam  Vitals reviewed.   Constitutional:       General: She is not in acute distress.     Appearance: Normal appearance. She is normal weight. She is not ill-appearing.   HENT:      Head: Normocephalic and atraumatic.   Eyes:      Extraocular Movements: Extraocular movements intact.      Pupils: Pupils are equal, round, and reactive to light.   Cardiovascular:      Rate and Rhythm: Normal rate.   Pulmonary:      Effort: Pulmonary effort is normal. No respiratory distress.   Musculoskeletal:  General: Normal range of motion.      Cervical back: Normal range of motion. No rigidity or tenderness.   Neurological:      Mental Status: She is alert.        Procedures     Procedures    Orders Placed This Encounter   Procedures    Visual acuity screening        Medications given during this emergency department visit:  Medications   magnesium oxide (MAG-OX) tablet 400 mg (400 mg Oral Given 03/31/22 0012)   ketorolac (TORADOL) injection 30 mg (30 mg IntraMUSCular Given 03/31/22 0011)   metoclopramide (REGLAN) tablet 10 mg (10 mg Oral Given 03/31/22 0011)       New Prescriptions    No medications on file        Past Medical History:   Diagnosis Date    Asthma         Past Surgical History:   Procedure Laterality Date    GYN      c/s        Social History     Socioeconomic History    Marital status: Single   Tobacco Use    Smoking status:  Former     Current packs/day: 0.00     Types: Cigarettes     Quit date: 03/05/2020     Years since quitting: 2.0    Smokeless tobacco: Never   Substance and Sexual Activity    Alcohol use: Yes    Drug use: No        Previous Medications    ALBUTEROL SULFATE HFA 108 (90 BASE) MCG/ACT INHALER    Inhale 2 puffs into the lungs every 4 hours as needed    FAMOTIDINE (PEPCID) 20 MG TABLET    Take 1 tablet by mouth 2 times daily as needed (reflux)    MELOXICAM (MOBIC) 15 MG TABLET    Take 1 tablet by mouth daily for 7 days    ONDANSETRON (ZOFRAN-ODT) 4 MG DISINTEGRATING TABLET    Take 2 tablets by mouth 3 times daily as needed for Nausea or Vomiting        No results found for any visits on 03/30/22.      No orders to display                No results for input(s): "COVID19" in the last 72 hours.     Voice dictation software was used during the making of this note.  This software is not perfect and grammatical and other typographical errors may be present.  This note has not been completely proofread for errors.     Antony Blackbird, [place]  03/31/22 [phone]

## 2022-03-30 NOTE — ED Triage Notes (Signed)
Pt. A/ox4 and ambulatory to triage. Pt. C/o intermittent headache x2wees. Pt. States ibuprofen 600-800 w/o relief.

## 2022-03-31 ENCOUNTER — Inpatient Hospital Stay: Admit: 2022-03-31 | Discharge: 2022-03-31 | Disposition: A | Discharge status: Home / Self Care | Payer: MEDICAID

## 2022-03-31 MED ORDER — METOCLOPRAMIDE HCL 10 MG PO TABS
10 | ORAL | Status: AC
Start: 2022-03-31 — End: 2022-03-31
  Administered 2022-03-31: 04:00:00 10 mg via ORAL

## 2022-03-31 MED ORDER — MAGNESIUM OXIDE -MG SUPPLEMENT 400 (240 MG) MG PO TABS
400 | Freq: Every day | ORAL | Status: DC
Start: 2022-03-31 — End: 2022-03-30

## 2022-03-31 MED ORDER — MAGNESIUM OXIDE -MG SUPPLEMENT 400 (240 MG) MG PO TABS
400240 (240 Mg) MG | Freq: Every day | ORAL | Status: DC
Start: 2022-03-31 — End: 2022-03-31
  Administered 2022-03-31: 04:00:00 400 mg via ORAL

## 2022-03-31 MED ORDER — KETOROLAC TROMETHAMINE 30 MG/ML IJ SOLN
30 MG/ML | Freq: Once | INTRAMUSCULAR | Status: AC
Start: 2022-03-31 — End: 2022-03-31
  Administered 2022-03-31: 04:00:00 30 mg via INTRAMUSCULAR

## 2022-03-31 MED FILL — KETOROLAC TROMETHAMINE 30 MG/ML IJ SOLN: 30 MG/ML | INTRAMUSCULAR | Qty: 1

## 2022-03-31 MED FILL — MAGNESIUM OXIDE -MG SUPPLEMENT 400 (240 MG) MG PO TABS: 400 (240 Mg) MG | ORAL | Qty: 1

## 2022-03-31 MED FILL — METOCLOPRAMIDE HCL 10 MG PO TABS: 10 MG | ORAL | Qty: 1

## 2022-03-31 NOTE — Discharge Instructions (Signed)
As discussed you very likely need to get eyeglasses and be evaluated by an optometrist.  Contact the number on the back of your insurance card or use your insurance card website to find where your insurance is excepted.  Continue taking Tylenol or ibuprofen as needed for headache relief.  Return as needed or if you have any new or worsening symptoms.

## 2022-03-31 NOTE — Discharge Instructions (Addendum)
Continuity of Care Form    Patient Name: Michelle Shaw   DOB:  [birth year]  MRN:  [id number]    Admit date:  03/30/2022  Discharge date:  ***    Code Status Order: No Order   Advance Directives:     Admitting Physician:  No admitting provider for patient encounter.  PCP: Not, On File (Inactive)    Discharging Nurse: [facility] Unit/Room#: FT11/11  Discharging Unit Phone Number: ***    Emergency Contact:   Extended Emergency Contact Information  Primary Emergency Contact: Luisa Hart [place] Phone: [phone]  Mobile Phone: [phone]  Relation: Other    Past Surgical History:  Past Surgical History:   Procedure Laterality Date    GYN      c/s       Immunization History:     There is no immunization history on file for this patient.    Active Problems:  There is no problem list on file for this patient.      Isolation/Infection:   Isolation            No Isolation          Patient Infection Status       Infection Onset Added Last Indicated Last Indicated By Review Planned Expiration Resolved Resolved By    None active    Resolved    COVID-19 10/31/20 10/31/20 10/31/20 COVID-19, Rapid   11/14/20 Infection Expired    COVID-19 02/18/20 02/18/20 02/18/20 Conversion, Epic   03/03/20 Conversion, Epic                       Nurse Assessment:  Last Vital Signs: BP 108/65   Pulse 93   Temp 98.6 F (37 C) (Oral)   Resp 18   SpO2 100%     Last documented pain score (0-10 scale):    Last Weight:   Wt Readings from Last 1 Encounters:   11/29/21 113.4 kg (250 lb)     Mental Status:  {IP PT MENTAL STATUS:20030}    IV Access:  {MH COC IV ACCESS:304088262}    Nursing Mobility/ADLs:  Walking   {CHP DME FH:7594535  Transfer  {CHP DME FH:7594535  Bathing  {CHP DME FH:7594535  Dressing  {CHP DME FH:7594535  Toileting  {CHP DME FH:7594535  Feeding  {CHP DME FH:7594535  Med Admin  {CHP DME FH:7594535  Med Delivery   {MH COC MED  Delivery:304088264}    Wound Care Documentation and Therapy:        Elimination:  Continence:   Bowel: {YES / CU:6749878  Bladder: {YES / CU:6749878  Urinary Catheter: {Urinary Catheter:304088013}   Colostomy/Ileostomy/Ileal Conduit: {YES / CU:6749878       Date of Last BM: ***  No intake or output data in the 24 hours ending 03/31/22 0114  No intake/output data recorded.    Safety Concerns:     [place] COC Safety Concerns:304088272}    Impairments/Disabilities:      [place] COC Impairments/Disabilities:304088273}    Nutrition Therapy:  Current Nutrition Therapy:   [place] COC Diet List:304088271}    Routes of Feeding: {CHP DME Other Feedings:304088042}  Liquids: {Slp liquid thickness:30034}  Daily Fluid Restriction: {CHP DME Yes amt example:304088041}  Last Modified Barium Swallow with Video (Video Swallowing Test): {Done Not Done KM:5866871    Treatments at the Time of Hospital Discharge:   Respiratory Treatments: ***  Oxygen Therapy:  {Therapy; copd oxygen:17808}  Ventilator:    {  MH CC Vent XC:8593717    Rehab Therapies: {THERAPEUTIC [phone]}  Weight Bearing Status/Restrictions: {MH CC Weight Bearing:304508812}  Other Medical Equipment (for information only, NOT a DME order):  {EQUIPMENT:304520077}  Other Treatments: ***    Patient's personal belongings (please select all that are sent with patient):  {CHP DME Belongings:304088044}    RN SIGNATURE:  Electronically signed by Freddie Breech, RN on 03/31/22 at 1:14 AM EDT    CASE MANAGEMENT/SOCIAL WORK SECTION    Inpatient Status Date: ***    Readmission Risk Assessment Score:  Readmission Risk              Risk of Unplanned Readmission:  0           Discharging to Facility/ Agency   Name:   Address:  Phone:  Fax:    Dialysis Facility (if applicable)   Name:  Address:  Dialysis Schedule:  Phone:  Fax:    Case Manager/Social Worker signature: {Esignature:304088025}    PHYSICIAN SECTION    Prognosis: {[phone]}    Condition at Discharge: [place]  Patient Condition:304088024}    Rehab Potential (if transferring to Rehab): {[phone]}    Recommended Labs or Other Treatments After Discharge: ***    Physician Certification: I certify the above information and transfer of Shemya Velic  is necessary for the continuing treatment of the diagnosis listed and that she requires {Admit to Appropriate Level of Care:20763} for {GREATER/LESS:304500278} 30 days.     Update Admission H&P: {CHP DME Changes in SJ:705696    PHYSICIAN SIGNATURE:  {Esignature:304088025}

## 2022-03-31 NOTE — ED Notes (Signed)
I have reviewed discharge instructions with the patient.  The patient verbalized understanding.    Patient left ED via Discharge Method: ambulatory to Home with self.  Opportunity for questions and clarification provided.       Patient given 0 scripts.         To continue your aftercare when you leave the hospital, you may receive an automated call from our care team to check in on how you are doing.  This is a free service and part of our promise to provide the best care and service to meet your aftercare needs." If you have questions, or wish to unsubscribe from this service please call [phone].  Thank you for Choosing our [place] [facility].

## 2023-02-03 ENCOUNTER — Inpatient Hospital Stay: Admit: 2023-02-03 | Discharge: 2023-02-03 | Disposition: A | Discharge status: Home / Self Care | Payer: MEDICAID

## 2023-02-03 DIAGNOSIS — L509 Urticaria, unspecified: Secondary | ICD-10-CM

## 2023-02-03 MED ORDER — HYDROXYZINE HCL 25 MG PO TABS
25 | ORAL_TABLET | Freq: Three times a day (TID) | ORAL | 0 refills | Status: DC | PRN
Start: 2023-02-03 — End: 2023-02-03

## 2023-02-03 MED ORDER — HYDROXYZINE HCL 25 MG PO TABS
25 | ORAL | Status: AC
Start: 2023-02-03 — End: 2023-02-03
  Administered 2023-02-03: 17:00:00 25 mg via ORAL

## 2023-02-03 MED ORDER — DEXAMETHASONE SODIUM PHOSPHATE 10 MG/ML IJ SOLN
10 | Freq: Once | INTRAMUSCULAR | Status: AC
Start: 2023-02-03 — End: 2023-02-03
  Administered 2023-02-03: 17:00:00 10 mg via INTRAMUSCULAR

## 2023-02-03 MED ORDER — PREDNISONE 10 MG (21) PO TBPK
10 | ORAL | 0 refills | Status: AC
Start: 2023-02-03 — End: ?

## 2023-02-03 MED ORDER — HYDROXYZINE HCL 25 MG PO TABS
25 | ORAL_TABLET | Freq: Three times a day (TID) | ORAL | 0 refills | Status: AC | PRN
Start: 2023-02-03 — End: 2023-02-13

## 2023-02-03 MED ORDER — PREDNISONE 10 MG (21) PO TBPK
10 | ORAL | 0 refills | Status: DC
Start: 2023-02-03 — End: 2023-02-03

## 2023-02-03 MED FILL — HYDROXYZINE HCL 25 MG PO TABS: 25 MG | ORAL | Qty: 1

## 2023-02-03 MED FILL — DEXAMETHASONE SODIUM PHOSPHATE 10 MG/ML IJ SOLN: 10 MG/ML | INTRAMUSCULAR | Qty: 1

## 2023-02-03 NOTE — Discharge Instructions (Addendum)
Take medication as prescribed.  Begin using hypoallergenic products -soaps, laundry detergents, lotions, etc.  Follow-up with primary care.  If this continues to occur and you are unable to discover the cause, they will need to refer you to an allergist.  Return to the emergency department for any new, worsening, or concerning symptoms.    We would love to help you get a primary care doctor for follow-up after your emergency department visit.    Please call [phone] between 7AM - 6PM Monday to Friday.  A care navigator will be able to assist you with setting up a doctor close to your home.

## 2023-02-03 NOTE — ED Notes (Signed)
 Patient mobility status  with no difficulty.     I have reviewed discharge instructions with the patient.  The patient verbalized understanding.    Patient left ED via Discharge Method: ambulatory to Home with  self .    Opportunity for questions and clarification provided.     Patient given 2 scripts.

## 2023-02-03 NOTE — ED Provider Notes (Signed)
Emergency Department Provider Note       PCP: Not, On File (Inactive)   Age: 33 y.o.   Sex: female     DISPOSITION Decision To Discharge 02/03/2023 01:02:27 PM    ICD-10-CM    1. Urticaria  L50.9           Medical Decision Making     33 year old female presents emergency department today with complaint of hives.  She appears in no acute distress.  She has scattered urticarial rash noted on exam.  I do not note any angioedema.  Lung sounds are clear.  No wheezing.  No stridor.  Denies any contact with any known triggers.  Will give dose of Atarax and Decadron here and monitor.    Patient observed while in the emergency department today.  She does have some improvement but still has hives present.  She is comfortable with discharge.  Will discharge on course of prednisone.  Advise follow-up with PCP.  ER return precautions discussed.     1 acute, uncomplicated illness or injury.  Prescription drug management performed.  Shared medical decision making was utilized in creating the patients health plan today.  I independently ordered and reviewed each unique test.                         History     33 year old female presents emergency department today with complaint of hives.  She states that symptoms started about 4 days ago.  Symptoms seem to be worse at night.  She states that she does have itching during the day but the rash seems to worsen during the night.  She states that she sleeps in the bed with her husband and 22-year-old and neither of them have a rash.  She states that she had multiple allergies as a child and was on allergy shots.  She states that last night she felt like she had some swelling in her throat but this is since resolved.  She feels like she has some swelling today to her face and mouth.  She denies any treatment for her symptoms.  She denies any known triggers for the symptoms.  She denies any shortness of breath.    The history is provided by the patient.     Physical Exam     Vitals  signs and nursing note reviewed:  Vitals:    02/03/23 1142   BP: 111/83   Pulse: 83   Resp: 19   Temp: 98.1 F (36.7 C)   TempSrc: Oral   SpO2: 99%   Weight: 117.9 kg (260 lb)   Height: 1.727 m (5\' 8" )      Physical Exam  Vitals and nursing note reviewed.   Constitutional:       General: She is not in acute distress.     Appearance: Normal appearance. She is well-developed. She is not ill-appearing, toxic-appearing or diaphoretic.   HENT:      Head: Normocephalic and atraumatic.      Mouth/Throat:      Mouth: No angioedema.      Pharynx: No pharyngeal swelling or uvula swelling.   Cardiovascular:      Rate and Rhythm: Normal rate.      Heart sounds: Normal heart sounds.   Pulmonary:      Effort: Pulmonary effort is normal. No respiratory distress.      Breath sounds: Normal breath sounds. No stridor.   Skin:     Findings:  Rash present. Rash is urticarial.      Comments: Scattered urticarial rash noted to arms and trunk.   Neurological:      General: No focal deficit present.      Mental Status: She is alert and oriented to person, place, and time.        Procedures     Procedures    Orders placed during this emergency department visit:   No orders of the defined types were placed in this encounter.       Medications given during this emergency department visit:     Medications   dexAMETHasone (DECADRON) injection 10 mg (10 mg IntraMUSCular Given 02/03/23 1226)   hydrOXYzine HCl (ATARAX) tablet 25 mg (25 mg Oral Given 02/03/23 1225)       New prescriptions:     Current Discharge Medication List        START taking these medications    Details   hydrOXYzine HCl (ATARAX) 25 MG tablet Take 1 tablet by mouth every 8 hours as needed for Itching  Qty: 30 tablet, Refills: 0      predniSONE 10 MG (21) TBPK Take as per package directions  Qty: 1 each, Refills: 0              Past History and Complexity:     Past Medical History:   Diagnosis Date    Asthma         Past Surgical History:   Procedure Laterality Date    GYN       c/s        Social History     Socioeconomic History    Marital status: Single     Spouse name: None    Number of children: None    Years of education: None    Highest education level: None   Tobacco Use    Smoking status: Every Day     Current packs/day: 0.00     Types: Cigarettes     Last attempt to quit: 03/05/2020     Years since quitting: 2.9    Smokeless tobacco: Never   Substance and Sexual Activity    Alcohol use: Yes    Drug use: No     Social Determinants of Health     Financial Resource Strain: Low Risk  (01/18/2021)    Received from [facility], [facility]    Financial Resource Strain     Difficulty Paying Living Expenses: Not hard at all     Difficulty Paying Medical Expenses: No   Food Insecurity: No Food Insecurity (01/18/2021)    Received from [facility], [facility]    Food Insecurity     Worried about Running Out of Food in the Last Year: Never true     Ran Out of Food in the Last Year: Never true   Transportation Needs: No Transportation Needs (01/18/2021)    Received from [facility], [facility]    Transportation Needs     Lack of Transportation: No   Physical Activity: Inactive (05/14/2021)    Received from [facility], [facility]    Physical Activity     Days of Exercise per Week: 0     Minutes of Exercise per Session: 0     Total Minutes of Exercise per Week: 0   Stress: No Stress Concern Present (01/18/2021)    Received from [facility], [facility]    Stress     Feeling of Stress : Not at  all   Social Connections: Unknown (05/20/2022)    Received from [facility]    Social Connections     Frequency of Communication with Friends and Family: Not asked     Frequency of Social Gatherings with Friends and Family: Not asked   Intimate Partner Violence: Unknown (01/22/2022)    Received from [facility], [facility]    Intimate Partner Violence     Fear of Current or Ex-Partner: Not asked     Emotionally Abused: Not asked     Physically Abused: Not asked     Sexually Abused: Not asked    Housing Stability: Not At Risk (01/18/2021)    Received from [facility], [facility]    Housing Stability     Was there a time when you did not have a steady place to sleep: No     Worried that the place you are staying is making you sick: No        Current Discharge Medication List        CONTINUE these medications which have NOT CHANGED    Details   ondansetron (ZOFRAN-ODT) 4 MG disintegrating tablet Take 2 tablets by mouth 3 times daily as needed for Nausea or Vomiting  Qty: 20 tablet, Refills: 0      famotidine (PEPCID) 20 MG tablet Take 1 tablet by mouth 2 times daily as needed (reflux)  Qty: 60 tablet, Refills: 0      meloxicam (MOBIC) 15 MG tablet Take 1 tablet by mouth daily for 7 days  Qty: 7 tablet, Refills: 0      albuterol sulfate HFA 108 (90 Base) MCG/ACT inhaler Inhale 2 puffs into the lungs every 4 hours as needed              Results from this emergency department visit:      No results found for any visits on 02/03/23.      No orders to display                No results for input(s): "COVID19" in the last 72 hours.     Voice dictation software was used during the making of this note.  This software is not perfect and grammatical and other typographical errors may be present.  This note has not been completely proofread for errors.        Lucille Passy, APRN - [place]  02/03/23 [phone]

## 2023-02-03 NOTE — ED Triage Notes (Signed)
Pt presents ambulatory to triage stating that she's been reaking out in hives for about four days.  She states that last night her face started swelling and her throat was starting to swell.  Pt woke up today with swelling to face and itching to face and inside of mouth.  She reports continued hives, reports that swallowing is "fine now".    Pt reports she has not taken any Benadryl or other OTC antihistamines.    Patient respirations even and unlabored. No distress noted in triage.

## 2023-12-14 ENCOUNTER — Inpatient Hospital Stay
Admit: 2023-12-14 | Discharge: 2023-12-15 | Disposition: A | Discharge status: Home / Self Care | Payer: Medicaid (Managed Care) | Arrived: VH

## 2023-12-14 ENCOUNTER — Emergency Department: Admit: 2023-12-14 | Payer: Medicaid (Managed Care) | Primary: Sports Medicine

## 2023-12-14 DIAGNOSIS — R112 Nausea with vomiting, unspecified: Principal | ICD-10-CM

## 2023-12-14 MED ORDER — ONDANSETRON 4 MG PO TBDP
4 | ORAL | Status: AC
Start: 2023-12-14 — End: 2023-12-14
  Administered 2023-12-15: 4 mg via ORAL

## 2023-12-14 NOTE — Discharge Instructions (Signed)
"  You have been diagnosed with a viral illness.  Use prescribed nausea medicine and abdominal pain medicine to help with your symptoms.  You can use over-the-counter diarrhea medicine as needed for diarrhea .  You can alternate Tylenol and Motrin every 4 hours as needed for body aches and sore throat.  For sore throat, you can use cough drops and warm salt water gargles.  For congestion you can use Flonase nasal spray and over-the-counter decongestants.  Be sure to drink plenty of fluids and stay hydrated.  Follow-up with your family doctor.    As we discussed, I did not find a life threatening cause of your symptoms today. However, THAT DOES NOT MEAN IT COULD NOT DEVELOP. If you develop ANY new or worsening symptoms, it is critical that you return for re-evaluation. This includes any symptoms that are concerning to you, especially symptoms such as fevers, chest pain, trouble breathing, worsening abdominal pain, trouble tolerating oral intake, trouble swallowing.  If you do not return for re-evaluation, you risk serious complications, including death.        "

## 2023-12-14 NOTE — ED Provider Notes (Signed)
 "   Emergency Department Provider Note       [facility] EMERGENCY DEPT   PCP: Not, On File (Inactive)   Age: 33 y.o.   Sex: female     DISPOSITION Decision To Discharge 12/14/2023 08:00:26 PM    ICD-10-CM    1. Nausea vomiting and diarrhea  R11.2     R19.7       2. Acute cough  R05.1       3. Congestion of nasal sinus  R09.81           Medical Decision Making     In summary this is a 33 year old female patient presenting for evaluation with 2 days of nausea and vomiting and diarrhea and chest soreness and shortness of breath and cough and congestion and myalgias and sore throat.  I suspect this plethora symptoms is related to a viral syndrome.  She is well-appearing overall with stable vitals.  Clinically nontoxic and I have low suspicion for sepsis.  Her abdomen is soft and nontender and I do not have any concern for acute abdominal pathology.  Her lungs are clear clinically and her chest x-ray is without evidence of pneumonia.  Oropharyngeal exam benign.  No nuchal rigidity.  I will treat this patient symptomatically with supportive care.  Counseled on signs to return for.  She will be discharged.  ED Course as of 12/14/23 2006   Sun Dec 14, 2023   8095 Independent interpretation of chest x-ray shows no evidence of pneumonia [NR]      ED Course User Index  [NR] Antoine Netter, [place]     I independently ordered and reviewed each unique test.    Over the counter drug management performed.  Prescription drug management performed.  Patient was discharged risks and benefits of hospitalization were considered.  Shared medical decision making was utilized in creating the patients health plan today.         I interpreted the X-rays no consolidation.              History     33 year old female patient presents today complaining of multiple symptoms ongoing for the past 2 days.  She states she has been dealing with some nausea and vomiting and diarrhea and chest soreness and shortness of breath and cough and congestion and sore  throat and bodyaches.  She describes her symptoms as constant and mild to moderate in severity.  Reports she was around a lot of family members that Thanksgiving and is not sure if she got something from them.  She denies any known fevers or chills.  She denies any pleuritic pain or hemoptysis . denies trismus or drooling or voice changes or difficulty swallowing.  States she has had some mild epigastric abdominal pain that she thinks might be from vomiting.  She denies any blood in stool or hematemesis.  Denies dysuria or urinary frequency or hematuria.  Patient is primary historian.    The history is provided by the patient. No language interpreter was used.       ROS     Review of Systems   Constitutional:  Positive for fatigue. Negative for fever.   HENT:  Positive for congestion and sore throat. Negative for facial swelling and trouble swallowing.    Eyes:  Negative for photophobia and visual disturbance.   Respiratory:  Positive for cough and shortness of breath.    Cardiovascular:  Positive for chest pain.   Gastrointestinal:  Positive for abdominal pain, diarrhea, nausea  and vomiting.   Genitourinary:  Negative for dysuria.   Musculoskeletal:  Positive for myalgias. Negative for neck stiffness.   Skin:  Negative for rash and wound.   Neurological:  Negative for dizziness, syncope, weakness and light-headedness.        Physical Exam     Vitals signs and nursing note reviewed:  Vitals:    12/14/23 1841   BP: 128/83   Pulse: 91   Resp: 16   Temp: 98.4 F (36.9 C)   TempSrc: Oral   SpO2: 100%   Weight: 118.3 kg (260 lb 12.8 oz)   Height: 1.727 m (5' 8)      Physical Exam  Vitals reviewed.   Constitutional:       General: She is not in acute distress.     Appearance: Normal appearance. She is obese. She is not ill-appearing, toxic-appearing or diaphoretic.      Comments: Pleasant and cooperative   HENT:      Head: Normocephalic and atraumatic.      Right Ear: Tympanic membrane, ear canal and external ear  normal.      Left Ear: Tympanic membrane, ear canal and external ear normal.      Nose: Congestion present.      Mouth/Throat:      Mouth: Mucous membranes are moist.      Pharynx: Oropharynx is clear. No oropharyngeal exudate or posterior oropharyngeal erythema.      Comments: Uvula midline  Eyes:      General:         Right eye: No discharge.         Left eye: No discharge.   Cardiovascular:      Rate and Rhythm: Normal rate and regular rhythm.      Pulses: Normal pulses.      Heart sounds: Normal heart sounds. No murmur heard.  Pulmonary:      Effort: Pulmonary effort is normal. No respiratory distress.      Breath sounds: Normal breath sounds. No stridor. No wheezing, rhonchi or rales.   Abdominal:      General: Abdomen is flat. There is no distension.      Palpations: Abdomen is soft. There is no mass.      Tenderness: There is no abdominal tenderness. There is no right CVA tenderness, left CVA tenderness, guarding or rebound.      Hernia: No hernia is present.   Musculoskeletal:      Cervical back: Normal range of motion and neck supple. No rigidity or tenderness.   Lymphadenopathy:      Cervical: No cervical adenopathy.   Skin:     General: Skin is warm and dry.      Coloration: Skin is not jaundiced.      Findings: No rash.   Neurological:      General: No focal deficit present.      Mental Status: She is alert. Mental status is at baseline.        Procedures     Procedures    Orders placed during this emergency department visit:     Orders Placed This Encounter   Procedures    COVID-19 & Influenza Combo    XR CHEST (2 VW)    CBC with Diff    CMP    Lipase    HCG Qualitative, Serum    Urinalysis, Micro    Diet NPO    POCT Urine Dipstick    POC Pregnancy Urine Qual  POCT Urinalysis no Micro    POC Pregnancy Urine Qual    Saline lock IV        Medications given during this emergency department visit:     Medications   ondansetron  (ZOFRAN -ODT) disintegrating tablet 4 mg (4 mg Oral Given 12/14/23 1919)        New prescriptions:     New Prescriptions    BROMPHENIRAMINE-PSEUDOEPHEDRINE-DM 2-30-10 MG/5ML SYRUP    Take 5 mLs by mouth 4 times daily as needed for Cough    HYOSCYAMINE  SULFATE SL (LEVSIN /SL) 0.125 MG SUBL    Place 1 tablet under the tongue 3 times daily as needed (abdominal pain)    ONDANSETRON  (ZOFRAN ) 4 MG TABLET    Take 1 tablet by mouth 3 times daily as needed for Nausea or Vomiting        Past History and Complexity:     Past Medical History:   Diagnosis Date    Asthma         Past Surgical History:   Procedure Laterality Date    GYN      c/s        Social History     Socioeconomic History    Marital status: Single     Spouse name: None    Number of children: None    Years of education: None    Highest education level: None   Tobacco Use    Smoking status: Every Day     Current packs/day: 0.00     Types: Cigarettes     Last attempt to quit: 03/05/2020     Years since quitting: 3.7    Smokeless tobacco: Never   Substance and Sexual Activity    Alcohol use: Yes    Drug use: No     Social Drivers of Psychologist, [employer] Strain: Low Risk  (01/18/2021)    Received from [facility]    Financial Resource Strain     Difficulty Paying Living Expenses: Not hard at all     Difficulty Paying Medical Expenses: No   Food Insecurity: No Food Insecurity (01/18/2021)    Received from [facility]    Food Insecurity     Worried about Running Out of Food in the Last Year: Never true     Ran Out of Food in the Last Year: Never true   Transportation Needs: No Transportation Needs (01/18/2021)    Received from [facility]    Transportation Needs     Lack of Transportation: No   Physical Activity: Inactive (05/14/2021)    Received from [facility]    Physical Activity     Days of Exercise per Week: 0 days     Minutes of Exercise per Session: 0     Total Minutes of Exercise per Week: 0   Stress: No Stress Concern Present (01/18/2021)    Received from [facility]    Stress     Feeling of Stress : Not at all   Social  Connections: Unknown (05/20/2022)    Received from [facility]    Social Connections     Frequency of Communication with Friends and Family: Not asked     Frequency of Social Gatherings with Friends and Family: Not asked   Intimate Partner Violence: Unknown (01/22/2022)    Received from [facility]    Intimate Partner Violence     Fear of Current or Ex-Partner: Not asked     Emotionally Abused: Not asked  Physically Abused: Not asked     Sexually Abused: Not asked   Housing Stability: Not At Risk (01/18/2021)    Received from [facility] Stability     Was there a time when you did not have a steady place to sleep: No     Worried that the place you are staying is making you sick: No        Previous Medications    ALBUTEROL SULFATE HFA 108 (90 BASE) MCG/ACT INHALER    Inhale 2 puffs into the lungs every 4 hours as needed    FAMOTIDINE  (PEPCID ) 20 MG TABLET    Take 1 tablet by mouth 2 times daily as needed (reflux)    MELOXICAM  (MOBIC ) 15 MG TABLET    Take 1 tablet by mouth daily for 7 days    ONDANSETRON  (ZOFRAN -ODT) 4 MG DISINTEGRATING TABLET    Take 2 tablets by mouth 3 times daily as needed for Nausea or Vomiting    PREDNISONE  10 MG (21) TBPK    Take as per package directions        Results from this emergency department visit:      Results for orders placed or performed during the hospital encounter of 12/14/23   COVID-19 & Influenza Combo    Specimen: Swab   Result Value Ref Range    Source NASAL      SARS-CoV-2, Rapid Not detected NOTD      Influenza A, NAA Not detected NOTD      Influenza B, NAA Not detected NOTD     XR CHEST (2 VW)    Narrative    Chest X-ray    INDICATION: Cough    COMPARISON:  None    TECHNIQUE: PA and lateral views of the chest were obtained.    FINDINGS: The lungs are clear. There are no infiltrates or effusions.  The heart  size is normal.  The bony thorax is intact.        Impression    No acute findings in the chest      Electronically signed by Lamar Dry   CBC with  [facility]   Result Value Ref Range    WBC 3.5 (L) 4.3 - 11.1 K/uL    RBC 4.17 4.05 - 5.2 M/uL    Hemoglobin 11.3 (L) 11.7 - 15.4 g/dL    Hematocrit 63.9 64.1 - 46.3 %    MCV 86.3 82.0 - 102.0 FL    MCH 27.1 26.1 - 32.9 PG    MCHC 31.4 31.4 - 35.0 g/dL    RDW 86.9 88.0 - 85.3 %    Platelets 188 150 - 450 K/uL    MPV 10.6 9.4 - 12.3 FL    nRBC 0.00 0.0 - 0.2 K/uL    Differential Type AUTOMATED      Neutrophils % 49.5 43.0 - 78.0 %    Lymphocytes % 36.0 13.0 - 44.0 %    Monocytes % 9.1 4.0 - 12.0 %    Eosinophils % 5.1 0.5 - 7.8 %    Basophils % 0.3 0.0 - 2.0 %    Immature Granulocytes % 0.0 0.0 - 5.0 %    Neutrophils Absolute 1.75 1.70 - 8.20 K/UL    Lymphocytes Absolute 1.27 0.50 - 4.60 K/UL    Monocytes Absolute 0.32 0.10 - 1.30 K/UL    Eosinophils Absolute 0.18 0.00 - 0.80 K/UL    Basophils Absolute 0.01 0.00 - 0.20 K/UL    Immature  Granulocytes Absolute 0.00 0.0 - 0.5 K/UL   CMP   Result Value Ref Range    Sodium 138 136 - 145 mmol/L    Potassium 3.7 3.5 - 5.1 mmol/L    Chloride 103 98 - 107 mmol/L    CO2 23 20 - 29 mmol/L    Anion Gap 12 7 - 16 mmol/L    Glucose 110 (H) 70 - 99 mg/dL    BUN 17 6 - 23 MG/DL    Creatinine 9.11 9.39 - 1.10 MG/DL    Est, Glom Filt Rate 88 >60 ml/min/1.39m2    Calcium 8.8 8.8 - 10.2 MG/DL    Total Bilirubin <9.7 0.0 - 1.2 MG/DL    ALT 21 8 - 45 U/L    AST 19 15 - 37 U/L    Alk Phosphatase 60 35 - 104 U/L    Total Protein 7.6 6.3 - 8.2 g/dL    Albumin 3.2 (L) 3.5 - 5.0 g/dL    Globulin 4.3 (H) 2.3 - 3.5 g/dL    Albumin/Globulin Ratio 0.7 (L) 1.0 - 1.9     Lipase   Result Value Ref Range    Lipase 25 13 - 60 U/L   HCG Qualitative, Serum   Result Value Ref Range    Preg, Serum Negative NEG     POCT Urinalysis no Micro   Result Value Ref Range    Specific Gravity, Urine, POC 1.025 (H) 1.001 - 1.023      pH, Urine, POC 6.5 5.0 - 9.0      Protein, Urine, POC Negative NEG mg/dL    Glucose, UA POC Negative NEG mg/dL    Ketones, Urine, POC Negative NEG mg/dL    Bilirubin, Urine, POC Negative NEG       Blood, UA POC Trace Intact (A) NEG      URINE UROBILINOGEN POC 2.0 (H) 0.2 - 1.0 EU/dL    Nitrite, Urine, POC Negative NEG      Leukocyte Est, UA POC Negative NEG      Performed by: Eleanor Ee    POC Pregnancy Urine Qual   Result Value Ref Range    Preg Test, Ur Negative NEG           XR CHEST (2 VW)   Final Result   No acute findings in the chest         Electronically signed by Lamar Dry                   Recent Labs     12/14/23  1915   COVID19 Not detected        Voice dictation software was used during the making of this note.  This software is not perfect and grammatical and other typographical errors may be present.  This note has not been completely proofread for errors.     Antoine Netter, [place]  12/14/23 2006    "

## 2023-12-14 NOTE — ED Triage Notes (Signed)
"  Pt CO n/v/d, generalized body weakness, and generalized abd pain x2 days.  "

## 2023-12-14 NOTE — ED Notes (Deleted)
 Report given to [facility]

## 2023-12-15 LAB — COMPREHENSIVE METABOLIC PANEL
ALT: 21 U/L (ref 8–45)
AST: 19 U/L (ref 15–37)
Albumin/Globulin Ratio: 0.7 — ABNORMAL LOW (ref 1.0–1.9)
Albumin: 3.2 g/dL — ABNORMAL LOW (ref 3.5–5.0)
Alk Phosphatase: 60 U/L (ref 35–104)
Anion Gap: 12 mmol/L (ref 7–16)
BUN: 17 mg/dL (ref 6–23)
CO2: 23 mmol/L (ref 20–29)
Calcium: 8.8 mg/dL (ref 8.8–10.2)
Chloride: 103 mmol/L (ref 98–107)
Creatinine: 0.88 mg/dL (ref 0.60–1.10)
Est, Glom Filt Rate: 88 ml/min/1.73m2 (ref >60)
Globulin: 4.3 g/dL — ABNORMAL HIGH (ref 2.3–3.5)
Glucose: 110 mg/dL — ABNORMAL HIGH (ref 70–99)
Potassium: 3.7 mmol/L (ref 3.5–5.1)
Sodium: 138 mmol/L (ref 136–145)
Total Bilirubin: <0.2 mg/dL (ref 0.0–1.2)
Total Protein: 7.6 g/dL (ref 6.3–8.2)

## 2023-12-15 LAB — POCT URINALYSIS DIPSTICK
Bilirubin, Urine, POC: NEGATIVE
Glucose, UA POC: NEGATIVE mg/dL
Ketones, Urine, POC: NEGATIVE mg/dL
Leukocyte Est, UA POC: NEGATIVE
Nitrite, Urine, POC: NEGATIVE
Protein, Urine, POC: NEGATIVE mg/dL
Specific Gravity, Urine, POC: 1.025 — ABNORMAL HIGH (ref 1.001–1.023)
URINE UROBILINOGEN POC: 2 EU/dL — ABNORMAL HIGH (ref 0.2–1.0)
pH, Urine, POC: 6.5 (ref 5.0–9.0)

## 2023-12-15 LAB — LIPASE: Lipase: 25 U/L (ref 13–60)

## 2023-12-15 LAB — COVID-19 & INFLUENZA COMBO
Influenza A, NAA: NOT DETECTED
Influenza B, NAA: NOT DETECTED
SARS-CoV-2, Rapid: NOT DETECTED

## 2023-12-15 LAB — CBC WITH AUTO DIFFERENTIAL
Basophils %: 0.3 % (ref 0.0–2.0)
Basophils Absolute: 0.01 K/UL (ref 0.00–0.20)
Eosinophils %: 5.1 % (ref 0.5–7.8)
Eosinophils Absolute: 0.18 K/UL (ref 0.00–0.80)
Hematocrit: 36 % (ref 35.8–46.3)
Hemoglobin: 11.3 g/dL — ABNORMAL LOW (ref 11.7–15.4)
Immature Granulocytes %: 0 % (ref 0.0–5.0)
Immature Granulocytes Absolute: 0 K/UL (ref 0.0–0.5)
Lymphocytes %: 36 % (ref 13.0–44.0)
Lymphocytes Absolute: 1.27 K/UL (ref 0.50–4.60)
MCH: 27.1 pg (ref 26.1–32.9)
MCHC: 31.4 g/dL (ref 31.4–35.0)
MCV: 86.3 FL (ref 82.0–102.0)
MPV: 10.6 FL (ref 9.4–12.3)
Monocytes %: 9.1 % (ref 4.0–12.0)
Monocytes Absolute: 0.32 K/UL (ref 0.10–1.30)
Neutrophils %: 49.5 % (ref 43.0–78.0)
Neutrophils Absolute: 1.75 K/UL (ref 1.70–8.20)
Platelets: 188 K/uL (ref 150–450)
RBC: 4.17 M/uL (ref 4.05–5.2)
RDW: 13 % (ref 11.9–14.6)
WBC: 3.5 K/uL — ABNORMAL LOW (ref 4.3–11.1)
nRBC: 0 K/uL (ref 0.0–0.2)

## 2023-12-15 LAB — HCG, SERUM, QUALITATIVE: Preg, Serum: NEGATIVE

## 2023-12-15 LAB — POC PREGNANCY UR-QUAL: Preg Test, Ur: NEGATIVE

## 2023-12-15 MED ORDER — HYOSCYAMINE SULFATE SL 0.125 MG SL SUBL
0.125 | Freq: Three times a day (TID) | SUBLINGUAL | 0 refills | 7.00000 days | Status: AC | PRN
Start: 2023-12-15 — End: ?

## 2023-12-15 MED ORDER — PSEUDOEPH-BROMPHEN-DM 30-2-10 MG/5ML PO SYRP
2-30-10 | Freq: Four times a day (QID) | ORAL | 0 refills | 9.50000 days | Status: AC | PRN
Start: 2023-12-15 — End: 2023-12-21

## 2023-12-15 MED ORDER — ONDANSETRON HCL 4 MG PO TABS
4 | ORAL_TABLET | Freq: Three times a day (TID) | ORAL | 0 refills | 7.00000 days | Status: AC | PRN
Start: 2023-12-15 — End: ?

## 2023-12-15 MED FILL — ONDANSETRON 4 MG PO TBDP: 4 mg | ORAL | Qty: 1 | Fill #0
